# Patient Record
Sex: Female | Born: 1950 | Race: Black or African American | Hispanic: No | Marital: Married | Smoking: Never smoker
Health system: Southern US, Community
[De-identification: ages and names within clinical notes are randomized; demographics above are authoritative.]

## PROBLEM LIST (undated history)

## (undated) DIAGNOSIS — I10 Essential (primary) hypertension: Secondary | ICD-10-CM

## (undated) DIAGNOSIS — M1990 Unspecified osteoarthritis, unspecified site: Secondary | ICD-10-CM

## (undated) HISTORY — DX: Essential (primary) hypertension: I10

## (undated) HISTORY — PX: ABDOMINAL HYSTERECTOMY: SHX81

## (undated) HISTORY — DX: Unspecified osteoarthritis, unspecified site: M19.90

---

## 2016-05-23 ENCOUNTER — Ambulatory Visit (INDEPENDENT_AMBULATORY_CARE_PROVIDER_SITE_OTHER): Payer: BLUE CROSS/BLUE SHIELD | Admitting: Physician Assistant

## 2016-05-23 ENCOUNTER — Encounter: Payer: Self-pay | Admitting: Physician Assistant

## 2016-05-23 VITALS — BP 130/80 | HR 73 | Temp 97.5°F | Resp 17 | Ht 63.0 in | Wt 166.0 lb

## 2016-05-23 DIAGNOSIS — R7309 Other abnormal glucose: Secondary | ICD-10-CM

## 2016-05-23 DIAGNOSIS — Z7189 Other specified counseling: Secondary | ICD-10-CM | POA: Diagnosis not present

## 2016-05-23 DIAGNOSIS — M5137 Other intervertebral disc degeneration, lumbosacral region: Secondary | ICD-10-CM

## 2016-05-23 DIAGNOSIS — Z7689 Persons encountering health services in other specified circumstances: Secondary | ICD-10-CM

## 2016-05-23 DIAGNOSIS — I10 Essential (primary) hypertension: Secondary | ICD-10-CM | POA: Insufficient documentation

## 2016-05-23 DIAGNOSIS — R7303 Prediabetes: Secondary | ICD-10-CM | POA: Insufficient documentation

## 2016-05-23 DIAGNOSIS — M47817 Spondylosis without myelopathy or radiculopathy, lumbosacral region: Secondary | ICD-10-CM | POA: Insufficient documentation

## 2016-05-23 DIAGNOSIS — M8580 Other specified disorders of bone density and structure, unspecified site: Secondary | ICD-10-CM | POA: Diagnosis not present

## 2016-05-23 DIAGNOSIS — E7800 Pure hypercholesterolemia, unspecified: Secondary | ICD-10-CM | POA: Diagnosis not present

## 2016-05-23 MED ORDER — TRIAMTERENE-HCTZ 75-50 MG PO TABS: 1.0000 | ORAL_TABLET | Freq: Every day | ORAL | 1 refills | Status: DC

## 2016-05-23 MED ORDER — TRIAMTERENE-HCTZ 75-50 MG PO TABS: 0.5000 | ORAL_TABLET | Freq: Every day | ORAL | 1 refills | Status: DC

## 2016-05-23 MED ORDER — CALCIUM CARBONATE-VITAMIN D 500-200 MG-UNIT PO TABS: 1.0000 | ORAL_TABLET | Freq: Every day | ORAL | 0 refills | Status: AC

## 2016-05-23 MED ORDER — BENAZEPRIL HCL 5 MG PO TABS: 5.0000 mg | ORAL_TABLET | Freq: Every day | ORAL | 1 refills | Status: DC

## 2016-05-23 NOTE — Patient Instructions (Addendum)
     IF you received an x-ray today, you will receive an invoice from [facility]. Please contact [facility] at [phone] with questions or concerns regarding your invoice.   IF you received labwork today, you will receive an invoice from [facility]. Please contact [facility] at [phone] with questions or concerns regarding your invoice.   Our billing staff will not be able to assist you with questions regarding bills from these companies.  You will be contacted with the lab results as soon as they are available. The fastest way to get your results is to activate your My Chart account. Instructions are located on the last page of this paperwork. If you have not heard from us regarding the results in 2 weeks, please contact this office.     We recommend that you schedule a mammogram for breast cancer screening. Typically, you do not need a referral to do this. Please contact a local imaging center to schedule your mammogram.  [facility] - [phone]  *ask for the Radiology Department [facility]) - [phone] or [phone]  [facility] - [phone] [facility] - [phone] [facility] - [phone]  *ask for the Radiology Department [facility] - [phone]  *ask for the Radiology Department [facility] - [phone]  *ask for the Mammography Department [facility] - [phone]  

## 2016-05-23 NOTE — Progress Notes (Signed)
05/23/2016 3:12 PM   DOB: [birth year] / MRN: [id number]  SUBJECTIVE:  Hannah Copeland is a 65 y.o. female presenting to establish care.  She has a history of elevated A1c, LDL, and takes two medications to control her BP with good success.  She has been taking these for the last 10 years.   She takes Gabapentin for a history of low back pain with good relief of radiculopahty.  Lumbar MR in 2017 shows that she has some significatn DJD at L4, L5 and S1, with severe right foraminal narrowing at L5-S1. (scanned into media)    She had a negative treadmill stress echo in 05/11/15 with a normal response to exercise. (scanned into media)  Bone scan in 2017 showed osteopenia and it was recommended she start oscal with vitamin D.  Repeat that scan 01/31/18. (scanned into media).   Depression screen PHQ 2/9 05/23/2016  Decreased Interest 0  Down, Depressed, Hopeless 0  PHQ - 2 Score 0      She has no allergies on file.   She  has a past medical history of Arthritis and Hypertension.    She  reports that she has never smoked. She has never used smokeless tobacco. She reports that she does not drink alcohol or use drugs. She  reports that she does not engage in sexual activity. The patient  has a past surgical history that includes Abdominal hysterectomy.  Her family history includes Diabetes in her father, paternal grandfather, and sister; Hyperlipidemia in her father; Hypertension in her father and sister.  Review of Systems  Respiratory: Negative for cough and shortness of breath.   Cardiovascular: Negative for chest pain, orthopnea and leg swelling.  Gastrointestinal: Negative for nausea.  Neurological: Negative for dizziness and headaches.    The problem list and medications were reviewed and updated by myself where necessary and exist elsewhere in the encounter.   OBJECTIVE:  BP 130/80 (BP Location: Right Arm, Patient Position: Sitting, Cuff Size: Normal)   Pulse 73   Temp 97.5 F (36.4  C) (Oral)   Resp 17   Ht 5\' 3"  (1.6 m)   Wt 166 lb (75.3 kg)   SpO2 98%   BMI 29.41 kg/m   Physical Exam  Constitutional: She is oriented to person, place, and time. She appears well-developed and well-nourished. No distress.  Cardiovascular: Normal rate, regular rhythm, normal heart sounds and intact distal pulses.  Exam reveals no gallop and no friction rub.   No murmur heard. Pulmonary/Chest: Effort normal and breath sounds normal. She has no wheezes. She has no rales.  Musculoskeletal: Normal range of motion.  Neurological: She is alert and oriented to person, place, and time. She has normal reflexes. She displays normal reflexes. No cranial nerve deficit. She exhibits normal muscle tone. Coordination normal.  Skin: Skin is warm and dry. She is not diaphoretic.  Psychiatric: She has a normal mood and affect. Her behavior is normal. Judgment and thought content normal.    No results found for this or any previous visit (from the past 72 hour(s)).  No results found.  ASSESSMENT AND PLAN  Nicole CellaDorothy was seen today for medication refill.  Diagnoses and all orders for this visit:  Essential hypertension -     CBC -     TSH -     COMPLETE METABOLIC PANEL WITH GFR -     Discontinue: triamterene-hydrochlorothiazide (MAXZIDE) 75-50 MG tablet; Take 1 tablet by mouth daily. -     benazepril (LOTENSIN) 5  MG tablet; Take 1 tablet (5 mg total) by mouth daily. -     triamterene-hydrochlorothiazide (MAXZIDE) 75-50 MG tablet; Take 0.5 tablets by mouth daily.  Encounter to establish care: See HPI.  I am rechecking her BP, A1c and lipid labs.  Advised she follow up in 6 months for refills.  See scanning tab for acces to MRI, bone scan, and stress echo, and previous labs.   Elevated LDL cholesterol level -     Lipid panel  Elevated hemoglobin A1c -     Lipid panel -     Hemoglobin A1C  DJD (degenerative joint disease), lumbosacral: She has enough gabapentin at this time.  Will be happy to  refill via phone if necessary.   Osteopenia -     calcium-vitamin D (OSCAL WITH D) 500-200 MG-UNIT tablet; Take 1 tablet by mouth daily with breakfast.  Other orders -     Cancel: benazepril (LOTENSIN) 5 MG tablet; Take 1 tablet (5 mg total) by mouth daily. -     Cancel: triamterene-hydrochlorothiazide (DYAZIDE) 50-25 MG capsule; Take 1 capsule by mouth every morning. -     Cancel: gabapentin (NEURONTIN) 300 MG capsule; Take 1 capsule (300 mg total) by mouth 3 (three) times daily.    The patient is advised to call or return to clinic if she does not see an improvement in symptoms, or to seek the care of the closest emergency department if she worsens with the above plan.   Deliah Boston, MHS, PA-C [facility] 05/23/2016 3:12 PM

## 2016-05-24 LAB — COMPLETE METABOLIC PANEL WITH GFR
ALT: 14 U/L (ref 6–29)
AST: 18 U/L (ref 10–35)
Albumin: 3.9 g/dL (ref 3.6–5.1)
Alkaline Phosphatase: 84 U/L (ref 33–130)
BUN: 12 mg/dL (ref 7–25)
CHLORIDE: 106 mmol/L (ref 98–110)
CO2: 26 mmol/L (ref 20–31)
CREATININE: 0.74 mg/dL (ref 0.50–0.99)
Calcium: 9.3 mg/dL (ref 8.6–10.4)
GFR, EST NON AFRICAN AMERICAN: 85 mL/min (ref >=60)
GLUCOSE: 79 mg/dL (ref 65–99)
Potassium: 3.9 mmol/L (ref 3.5–5.3)
SODIUM: 141 mmol/L (ref 135–146)
TOTAL PROTEIN: 6.9 g/dL (ref 6.1–8.1)
Total Bilirubin: 0.4 mg/dL (ref 0.2–1.2)

## 2016-05-24 LAB — CBC
HCT: 37.1 % (ref 35.0–45.0)
HEMOGLOBIN: 11.9 g/dL (ref 11.7–15.5)
MCH: 27.3 pg (ref 27.0–33.0)
MCHC: 32.1 g/dL (ref 32.0–36.0)
MCV: 85.1 fL (ref 80.0–100.0)
MPV: 9.4 fL (ref 7.5–12.5)
PLATELETS: 260 10*3/uL (ref 140–400)
RBC: 4.36 MIL/uL (ref 3.80–5.10)
RDW: 13.3 % (ref 11.0–15.0)
WBC: 6.8 10*3/uL (ref 3.8–10.8)

## 2016-05-24 LAB — LIPID PANEL
Cholesterol: 256 mg/dL — ABNORMAL HIGH (ref 125–200)
HDL: 68 mg/dL (ref >=46)
LDL CALC: 163 mg/dL — AB (ref <130)
TRIGLYCERIDES: 124 mg/dL (ref <150)
Total CHOL/HDL Ratio: 3.8 Ratio (ref <=5.0)
VLDL: 25 mg/dL (ref <30)

## 2016-05-24 LAB — HEMOGLOBIN A1C
HEMOGLOBIN A1C: 6.1 % — AB (ref <5.7)
Mean Plasma Glucose: 128 mg/dL

## 2016-05-24 LAB — TSH: TSH: 1.99 m[IU]/L

## 2016-05-25 ENCOUNTER — Other Ambulatory Visit: Payer: Self-pay | Admitting: Physician Assistant

## 2016-05-25 DIAGNOSIS — E7800 Pure hypercholesterolemia, unspecified: Secondary | ICD-10-CM | POA: Insufficient documentation

## 2016-05-25 MED ORDER — ATORVASTATIN CALCIUM 20 MG PO TABS: 20.0000 mg | ORAL_TABLET | Freq: Every day | ORAL | 1 refills | Status: DC

## 2016-12-08 ENCOUNTER — Other Ambulatory Visit: Payer: Self-pay | Admitting: Physician Assistant

## 2016-12-08 DIAGNOSIS — I10 Essential (primary) hypertension: Secondary | ICD-10-CM

## 2017-01-30 ENCOUNTER — Other Ambulatory Visit: Payer: Self-pay | Admitting: Physician Assistant

## 2017-01-30 DIAGNOSIS — I10 Essential (primary) hypertension: Secondary | ICD-10-CM

## 2017-02-18 ENCOUNTER — Encounter: Payer: Self-pay | Admitting: Family Medicine

## 2017-02-18 ENCOUNTER — Ambulatory Visit (INDEPENDENT_AMBULATORY_CARE_PROVIDER_SITE_OTHER): Payer: BLUE CROSS/BLUE SHIELD | Admitting: Family Medicine

## 2017-02-18 VITALS — BP 133/76 | HR 68 | Temp 98.2°F | Resp 18 | Ht 63.0 in | Wt 162.6 lb

## 2017-02-18 DIAGNOSIS — E7800 Pure hypercholesterolemia, unspecified: Secondary | ICD-10-CM | POA: Diagnosis not present

## 2017-02-18 DIAGNOSIS — Z5181 Encounter for therapeutic drug level monitoring: Secondary | ICD-10-CM | POA: Diagnosis not present

## 2017-02-18 DIAGNOSIS — I10 Essential (primary) hypertension: Secondary | ICD-10-CM | POA: Diagnosis not present

## 2017-02-18 DIAGNOSIS — R600 Localized edema: Secondary | ICD-10-CM

## 2017-02-18 DIAGNOSIS — Z1231 Encounter for screening mammogram for malignant neoplasm of breast: Secondary | ICD-10-CM

## 2017-02-18 DIAGNOSIS — R7303 Prediabetes: Secondary | ICD-10-CM

## 2017-02-18 MED ORDER — BENAZEPRIL HCL 5 MG PO TABS: ORAL_TABLET | ORAL | 1 refills | Status: DC

## 2017-02-18 MED ORDER — TRIAMTERENE-HCTZ 75-50 MG PO TABS: 0.5000 | ORAL_TABLET | Freq: Every day | ORAL | 1 refills | Status: DC

## 2017-02-18 NOTE — Progress Notes (Addendum)
Subjective:    Patient ID: Hannah Copeland, female    DOB: 1951-02-11, 66 y.o.   MRN: [id number] Chief Complaint  Patient presents with  . Medication Refill    Atorvastatin 20mg  ,  Triamterene HCTZ 75-50mg , Benazepril 5mg  and wants blood work    HPI  HLD: Patient was initially started on statin 9 months ago after labs returned showing LDL of 163 and non-HDL of 308 with elevated ASCVD risk. Patient has not returned to clinic for recheck since.  She was on simvastatin with myalgias but now doesn't which she attributes to the benazepril helping.   Ate breakfast at 5:15 a.m. Before she goes to work - oatmeal, sardines, bread. Then bowl of cereal at 9 a. And fastin since - very hungry. So fasting for 8 hours.    No urinary sxs though does notice increased urinary frequency/quantity on some days. DOes bave some urinary urgency at times but t  Does get cc lower ext edema with varicose veins intermittently.   Does have 32yrs of sleep difficlty with early wakenings. She has always tried to work on sleep hygiene habits. Once she was rx'ed [place]. She tried melatonin once but was up all night. Used to take benadryl .  Sleeps better after going on a long walk. Drinks chamomile tea.   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 Then tried cortisone shots.Was hgaving foot muscle spasms/contractions. Has arthritis in hands. Initially did cause sedation but then  Resolves so now uses prn.   Did have nml stress test last yr. POET. Does not recall echo but was nervous.   Did try compression socks once which helped - consider trying again.    Past Medical History:  Diagnosis Date  . Arthritis   . Hypertension    Past Surgical History:  Procedure Laterality Date  . ABDOMINAL HYSTERECTOMY     Current Outpatient Prescriptions on File Prior to Visit  Medication Sig Dispense Refill  . atorvastatin (LIPITOR) 20  MG tablet TAKE 1 TABLET(20 MG) BY MOUTH DAILY 30 tablet 0  . atorvastatin (LIPITOR) 20 MG tablet TAKE 1 TABLET(20 MG) BY MOUTH DAILY 30 tablet 0  . benazepril (LOTENSIN) 5 MG tablet TAKE 1 TABLET(5 MG) BY MOUTH DAILY 30 tablet 0  . benazepril (LOTENSIN) 5 MG tablet TAKE 1 TABLET(5 MG) BY MOUTH DAILY 30 tablet 0  . calcium-vitamin D (OSCAL WITH D) 500-200 MG-UNIT tablet Take 1 tablet by mouth daily with breakfast. 30 tablet 0  . triamterene-hydrochlorothiazide (MAXZIDE) 75-50 MG tablet Take 0.5 tablets by mouth daily. 45 tablet 1  . gabapentin (NEURONTIN) 300 MG capsule Take 300 mg by mouth 3 (three) times daily.     No current facility-administered medications on file prior to visit.    No Known Allergies Family History  Problem Relation Age of Onset  . Diabetes Father   . Hyperlipidemia Father   . Hypertension Father   . Diabetes Paternal Grandfather   . Diabetes Sister   . Hypertension Sister    Social History   Social History  . Marital status: Married    Spouse name: N/A  . Number of children: N/A  . Years of education: N/A   Social History Main Topics  . Smoking status: Never Smoker  . Smokeless tobacco: Never Used  . Alcohol use No  . Drug use: No  . Sexual activity: No  Other Topics Concern  . None   Social History Narrative  . None    Neck pops a lot.  Had 2 episodes of tingling in left hand no weakenss but sometmes hands cramp up.  Review of Systems     Objective:   Physical Exam  Constitutional: She is oriented to person, place, and time. She appears well-developed and well-nourished. No distress.  HENT:  Head: Normocephalic and atraumatic.  Right Ear: External ear normal.  Left Ear: External ear normal.  Eyes: Conjunctivae are normal. No scleral icterus.  Neck: Normal range of motion. Neck supple. No thyromegaly present.  Cardiovascular: Normal rate, regular rhythm, normal heart sounds and intact distal pulses.   2+ pitting edema to knees    Pulmonary/Chest: Effort normal and breath sounds normal. No respiratory distress.  Musculoskeletal: She exhibits no edema.  Lymphadenopathy:    She has no cervical adenopathy.  Neurological: She is alert and oriented to person, place, and time.  Skin: Skin is warm and dry. She is not diaphoretic. No erythema.  Psychiatric: She has a normal mood and affect. Her behavior is normal.   BP 133/76   Pulse 68   Temp 98.2 F (36.8 C) (Oral)   Resp 18   Ht 5\' 3"  (1.6 m)   Wt 162 lb 9.6 oz (73.8 kg)   SpO2 97%   BMI 28.80 kg/m     [facility] reading (PRIMARY) by  Dr. Clelia CroftShaw. EKG: NSR, no acute ischemic changes, none prior avail for comparison Assessment & Plan:  Refill lipitor after labs come back.   1. Essential hypertension - controlled on current regimen, cont  2. Prediabetes - recheck in 6 mos Lab Results  Component Value Date   HGBA1C 6.4 (H) 02/18/2017    3. Hypercholesterolemia - for some reason, pt's lipid panel just did not result so cont atorvastatin 20 for now and needs to be rechecked at next OV in 6 mos  4. Medication monitoring encounter   5. Pedal edema - bmp nml  6. Visit for screening mammogram     Orders Placed This Encounter  Procedures  . MM Digital Screening    Standing Status:   Future    Standing Expiration Date:   04/20/2018    Order Specific Question:   Reason for Exam (SYMPTOM  OR DIAGNOSIS REQUIRED)    Answer:   screening    Order Specific Question:   Preferred imaging [facility]?    [facility]  . Comprehensive metabolic panel  . Hemoglobin A1c  . Lipid panel    Order Specific Question:   Has the patient fasted?    Answer:   Yes  . Brain natriuretic peptide  . Brain natriuretic peptide  . EKG 12-Lead    Meds ordered this encounter  Medications  . triamterene-hydrochlorothiazide (MAXZIDE) 75-50 MG tablet    Sig: Take 0.5 tablets by mouth daily.    Dispense:  45 tablet    Refill:  1  . benazepril (LOTENSIN) 5 MG tablet    Sig: TAKE 1  TABLET(5 MG) BY MOUTH DAILY    Dispense:  90 tablet    Refill:  1    Needs office visit for refills     Norberto SorensonEva Nadia Torr, M.D.  Primary Care at [facility]  [place] [address] [place], [place] [address] [phone] phone [phone] fax  02/20/17 11:59 PM

## 2017-02-18 NOTE — Patient Instructions (Addendum)
   We recommend that you schedule a mammogram for breast cancer screening. Typically, you do not need a referral to do this. Please contact a local imaging center to schedule your mammogram.  [facility] - [phone] or [phone]   IF you received an x-ray today, you will receive an invoice from [facility]. Please contact [facility] at [phone] with questions or concerns regarding your invoice.   IF you received labwork today, you will receive an invoice from [facility]. Please contact [facility] at [phone] with questions or concerns regarding your invoice.   Our billing staff will not be able to assist you with questions regarding bills from these companies.  You will be contacted with the lab results as soon as they are available. The fastest way to get your results is to activate your My Chart account. Instructions are located on the last page of this paperwork. If you have not heard from us regarding the results in 2 weeks, please contact this office.      

## 2017-02-19 LAB — COMPREHENSIVE METABOLIC PANEL
A/G RATIO: 1.5 (ref 1.2–2.2)
ALBUMIN: 4.2 g/dL (ref 3.6–4.8)
ALT: 19 IU/L (ref 0–32)
AST: 21 IU/L (ref 0–40)
Alkaline Phosphatase: 93 IU/L (ref 39–117)
BILIRUBIN TOTAL: 0.3 mg/dL (ref 0.0–1.2)
BUN / CREAT RATIO: 23 (ref 12–28)
BUN: 18 mg/dL (ref 8–27)
CALCIUM: 9.5 mg/dL (ref 8.7–10.3)
CHLORIDE: 104 mmol/L (ref 96–106)
CO2: 28 mmol/L (ref 18–29)
Creatinine, Ser: 0.78 mg/dL (ref 0.57–1.00)
GFR, EST AFRICAN AMERICAN: 91 mL/min/{1.73_m2} (ref >59)
GFR, EST NON AFRICAN AMERICAN: 79 mL/min/{1.73_m2} (ref >59)
GLUCOSE: 87 mg/dL (ref 65–99)
Globulin, Total: 2.8 g/dL (ref 1.5–4.5)
Potassium: 3.9 mmol/L (ref 3.5–5.2)
Sodium: 143 mmol/L (ref 134–144)
TOTAL PROTEIN: 7 g/dL (ref 6.0–8.5)

## 2017-02-19 LAB — BRAIN NATRIURETIC PEPTIDE: BNP: 42.4 pg/mL (ref 0.0–100.0)

## 2017-02-19 LAB — HEMOGLOBIN A1C
Est. average glucose Bld gHb Est-mCnc: 137 mg/dL
Hgb A1c MFr Bld: 6.4 % — ABNORMAL HIGH (ref 4.8–5.6)

## 2017-04-03 ENCOUNTER — Other Ambulatory Visit: Payer: Self-pay | Admitting: Family Medicine

## 2017-04-25 ENCOUNTER — Ambulatory Visit (HOSPITAL_BASED_OUTPATIENT_CLINIC_OR_DEPARTMENT_OTHER)
Admission: RE | Admit: 2017-04-25 | Discharge: 2017-04-25 | Disposition: A | Discharge status: Home / Self Care | Payer: BLUE CROSS/BLUE SHIELD | Source: Ambulatory Visit | Attending: Family Medicine | Admitting: Family Medicine

## 2017-04-25 ENCOUNTER — Encounter (HOSPITAL_BASED_OUTPATIENT_CLINIC_OR_DEPARTMENT_OTHER): Payer: Self-pay

## 2017-04-25 DIAGNOSIS — Z1231 Encounter for screening mammogram for malignant neoplasm of breast: Secondary | ICD-10-CM | POA: Diagnosis not present

## 2017-05-02 ENCOUNTER — Other Ambulatory Visit: Payer: Self-pay | Admitting: Family Medicine

## 2017-05-02 DIAGNOSIS — I10 Essential (primary) hypertension: Secondary | ICD-10-CM

## 2017-05-10 ENCOUNTER — Other Ambulatory Visit: Payer: Self-pay | Admitting: Family Medicine

## 2017-06-21 ENCOUNTER — Other Ambulatory Visit: Payer: Self-pay | Admitting: Family Medicine

## 2017-06-21 DIAGNOSIS — I10 Essential (primary) hypertension: Secondary | ICD-10-CM

## 2017-07-17 ENCOUNTER — Other Ambulatory Visit: Payer: Self-pay | Admitting: Family Medicine

## 2017-07-17 DIAGNOSIS — I10 Essential (primary) hypertension: Secondary | ICD-10-CM

## 2017-08-05 ENCOUNTER — Other Ambulatory Visit: Payer: Self-pay | Admitting: Family Medicine

## 2017-08-05 DIAGNOSIS — I10 Essential (primary) hypertension: Secondary | ICD-10-CM

## 2017-08-05 NOTE — Telephone Encounter (Signed)
Needs  Office  Visit  For  Refill

## 2017-08-06 ENCOUNTER — Other Ambulatory Visit: Payer: Self-pay

## 2017-08-06 ENCOUNTER — Other Ambulatory Visit: Payer: Self-pay | Admitting: Family Medicine

## 2017-08-06 DIAGNOSIS — I10 Essential (primary) hypertension: Secondary | ICD-10-CM

## 2017-08-06 MED ORDER — BENAZEPRIL HCL 5 MG PO TABS: 5.0000 mg | ORAL_TABLET | Freq: Every day | ORAL | 0 refills | Status: DC

## 2017-08-13 ENCOUNTER — Ambulatory Visit: Payer: BLUE CROSS/BLUE SHIELD | Admitting: Physician Assistant

## 2017-08-13 ENCOUNTER — Other Ambulatory Visit: Payer: Self-pay

## 2017-08-13 ENCOUNTER — Encounter: Payer: Self-pay | Admitting: Physician Assistant

## 2017-08-13 VITALS — BP 118/70 | HR 80 | Temp 98.1°F | Resp 18 | Ht 63.0 in | Wt 162.2 lb

## 2017-08-13 DIAGNOSIS — H6120 Impacted cerumen, unspecified ear: Secondary | ICD-10-CM | POA: Diagnosis not present

## 2017-08-13 NOTE — Patient Instructions (Signed)
Thank you for letting me participate in your health and well being.   Earwax Buildup, Adult The ears produce a substance called earwax that helps keep bacteria out of the ear and protects the skin in the ear canal. Occasionally, earwax can build up in the ear and cause discomfort or hearing loss. What increases the risk? This condition is more likely to develop in people who:  Are female.  Are elderly.  Naturally produce more earwax.  Clean their ears often with cotton swabs.  Use earplugs often.  Use in-ear headphones often.  Wear hearing aids.  Have narrow ear canals.  Have earwax that is overly thick or sticky.  Have eczema.  Are dehydrated.  Have excess hair in the ear canal.  What are the signs or symptoms? Symptoms of this condition include:  Reduced or muffled hearing.  A feeling of fullness in the ear or feeling that the ear is plugged.  Fluid coming from the ear.  Ear pain.  Ear itch.  Ringing in the ear.  Coughing.  An obvious piece of earwax that can be seen inside the ear canal.  How is this diagnosed? This condition may be diagnosed based on:  Your symptoms.  Your medical history.  An ear exam. During the exam, your health care provider will look into your ear with an instrument called an otoscope.  You may have tests, including a hearing test. How is this treated? This condition may be treated by:  Using ear drops to soften the earwax.  Having the earwax removed by a health care provider. The health care provider may: ? Flush the ear with water. ? Use an instrument that has a loop on the end (curette). ? Use a suction device.  Surgery to remove the wax buildup. This may be done in severe cases.  Follow these instructions at home:  Take over-the-counter and prescription medicines only as told by your health care provider.  Do not put any objects, including cotton swabs, into your ear. You can clean the opening of your ear canal  with a washcloth or facial tissue.  Follow instructions from your health care provider about cleaning your ears. Do not over-clean your ears.  Drink enough fluid to keep your urine clear or pale yellow. This will help to thin the earwax.  Keep all follow-up visits as told by your health care provider. If earwax builds up in your ears often or if you use hearing aids, consider seeing your health care provider for routine, preventive ear cleanings. Ask your health care provider how often you should schedule your cleanings.  If you have hearing aids, clean them according to instructions from the manufacturer and your health care provider. Contact a health care provider if:  You have ear pain.  You develop a fever.  You have blood, pus, or other fluid coming from your ear.  You have hearing loss.  You have ringing in your ears that does not go away.  Your symptoms do not improve with treatment.  You feel like the room is spinning (vertigo). Summary  Earwax can build up in the ear and cause discomfort or hearing loss.  The most common symptoms of this condition include reduced or muffled hearing and a feeling of fullness in the ear or feeling that the ear is plugged.  This condition may be diagnosed based on your symptoms, your medical history, and an ear exam.  This condition may be treated by using ear drops to soften the  earwax or by having the earwax removed by a health care provider.  Do not put any objects, including cotton swabs, into your ear. You can clean the opening of your ear canal with a washcloth or facial tissue. This information is not intended to replace advice given to you by your health care provider. Make sure you discuss any questions you have with your health care provider. Document Released: 10/11/2004 Document Revised: 11/14/2016 Document Reviewed: 11/14/2016 Elsevier Interactive Patient Education  [employer]2018 Elsevier Inc.

## 2017-08-13 NOTE — Progress Notes (Signed)
   Hannah Copeland  MRN: [id number] DOB: 06-11-1951  Subjective:  Hannah Copeland is a 66 y.o. female seen in office today for a chief complaint of need of ear flushing. She feels like her left ear canal has some wax that keeps moving in it. Denies ear pain,ear itching, ear discharge, tinnitus, and hearing loss. Denies use of earbuds or qtips. Has not tried anything for relief. Has had to get them cleaned out in the past. Denies smoking.   Review of Systems  Constitutional: Negative for chills, fatigue and fever.  HENT: Negative for congestion and sinus pressure.   Neurological: Negative for dizziness and light-headedness.    Patient Active Problem List   Diagnosis Date Noted  . Hypercholesterolemia 05/25/2016  . DJD (degenerative joint disease), lumbosacral 05/23/2016  . Osteopenia 05/23/2016  . HTN (hypertension) 05/23/2016  . Prediabetes 05/23/2016    Current Outpatient Medications on File Prior to Visit  Medication Sig Dispense Refill  . atorvastatin (LIPITOR) 20 MG tablet TAKE 1 TABLET(20 MG) BY MOUTH DAILY 90 tablet 1  . benazepril (LOTENSIN) 5 MG tablet Take 1 tablet (5 mg total) daily by mouth. 30 tablet 0  . calcium-vitamin D (OSCAL WITH D) 500-200 MG-UNIT tablet Take 1 tablet by mouth daily with breakfast. 30 tablet 0  . gabapentin (NEURONTIN) 300 MG capsule Take 300 mg by mouth 3 (three) times daily.    Marland Kitchen. triamterene-hydrochlorothiazide (MAXZIDE) 75-50 MG tablet Take 0.5 tablets by mouth daily. 45 tablet 1   No current facility-administered medications on file prior to visit.     No Known Allergies   Objective:  BP 118/70   Pulse 80   Temp 98.1 F (36.7 C) (Oral)   Resp 18   Ht 5\' 3"  (1.6 m)   Wt 162 lb 3.2 oz (73.6 kg)   SpO2 96%   BMI 28.73 kg/m   Physical Exam  Constitutional: She is oriented to person, place, and time and well-developed, well-nourished, and in no distress.  HENT:  Head: Normocephalic and atraumatic.  Right Ear: Tympanic membrane normal.  There is drainage ( mild amount of dark brown cerumen in ear canal,  TM visible). No mastoid tenderness. Tympanic membrane is not erythematous and not bulging. No decreased hearing is noted.  Left Ear: Tympanic membrane normal. There is drainage (moderate amount of dark brown cerumen sitting in ear canal, TM still visible. ). No mastoid tenderness. Tympanic membrane is not erythematous and not bulging. No decreased hearing is noted.  Eyes: Conjunctivae are normal.  Neck: Normal range of motion.  Pulmonary/Chest: Effort normal.  Neurological: She is alert and oriented to person, place, and time. Gait normal.  Skin: Skin is warm and dry.  Psychiatric: Affect normal.  Vitals reviewed.  No cerumen noted in left ear canal after ear lavage.  Mild cerumen noted in right ear canal.  Ear curette used to remove remaining cerumen.  Patient tolerated well.  Notes the sensation of something in her ear canal she was having before the ear lavage was performed has resolved. Assessment and Plan :  1. Wax in ear Resolved.  Patient reports she feels much better.  Follow-up as needed. - Ear wax removal  Benjiman CoreBrittany Annali Lybrand PA-C  Primary Care at [facility]  [facility] 08/13/2017 11:53 AM

## 2017-08-14 ENCOUNTER — Other Ambulatory Visit: Payer: Self-pay | Admitting: Family Medicine

## 2017-08-14 DIAGNOSIS — I10 Essential (primary) hypertension: Secondary | ICD-10-CM

## 2017-10-19 ENCOUNTER — Other Ambulatory Visit: Payer: Self-pay | Admitting: Family Medicine

## 2017-10-21 NOTE — Telephone Encounter (Signed)
Requesting refill for gabapentin. Historical provider.  Please advise.

## 2017-10-23 ENCOUNTER — Other Ambulatory Visit: Payer: Self-pay | Admitting: Family Medicine

## 2017-10-23 MED ORDER — GABAPENTIN 300 MG PO CAPS: 300.0000 mg | ORAL_CAPSULE | Freq: Three times a day (TID) | ORAL | 11 refills | Status: DC

## 2017-10-23 NOTE — Progress Notes (Signed)
Refill sent in

## 2017-11-09 ENCOUNTER — Other Ambulatory Visit: Payer: Self-pay

## 2017-11-09 ENCOUNTER — Encounter: Payer: Self-pay | Admitting: Family Medicine

## 2017-11-09 ENCOUNTER — Ambulatory Visit: Payer: BLUE CROSS/BLUE SHIELD | Admitting: Family Medicine

## 2017-11-09 VITALS — BP 118/68 | HR 68 | Temp 97.9°F | Resp 18 | Ht 63.0 in | Wt 161.4 lb

## 2017-11-09 DIAGNOSIS — I10 Essential (primary) hypertension: Secondary | ICD-10-CM

## 2017-11-09 DIAGNOSIS — E119 Type 2 diabetes mellitus without complications: Secondary | ICD-10-CM | POA: Diagnosis not present

## 2017-11-09 DIAGNOSIS — Z5181 Encounter for therapeutic drug level monitoring: Secondary | ICD-10-CM | POA: Diagnosis not present

## 2017-11-09 DIAGNOSIS — E7800 Pure hypercholesterolemia, unspecified: Secondary | ICD-10-CM | POA: Diagnosis not present

## 2017-11-09 LAB — POCT URINALYSIS DIP (MANUAL ENTRY)
BILIRUBIN UA: NEGATIVE mg/dL
Bilirubin, UA: NEGATIVE
Glucose, UA: NEGATIVE mg/dL
Nitrite, UA: NEGATIVE
PH UA: 6 (ref 5.0–8.0)
Protein Ur, POC: NEGATIVE mg/dL
RBC UA: NEGATIVE
Spec Grav, UA: <=1.005 — AB (ref 1.010–1.025)
Urobilinogen, UA: 0.2 E.U./dL

## 2017-11-09 LAB — POCT GLYCOSYLATED HEMOGLOBIN (HGB A1C): Hemoglobin A1C: 6.8

## 2017-11-09 MED ORDER — ATORVASTATIN CALCIUM 20 MG PO TABS: ORAL_TABLET | ORAL | 3 refills | Status: DC

## 2017-11-09 MED ORDER — TRIAMTERENE-HCTZ 75-50 MG PO TABS: 0.5000 | ORAL_TABLET | Freq: Every day | ORAL | 1 refills | Status: DC

## 2017-11-09 NOTE — Patient Instructions (Addendum)
   IF you received an x-ray today, you will receive an invoice from [facility]. Please contact  Radiology at [phone] with questions or concerns regarding your invoice.   IF you received labwork today, you will receive an invoice from [facility]. Please contact [facility] at [phone] with questions or concerns regarding your invoice.   Our billing staff will not be able to assist you with questions regarding bills from these companies.  You will be contacted with the lab results as soon as they are available. The fastest way to get your results is to activate your My Chart account. Instructions are located on the last page of this paperwork. If you have not heard from us regarding the results in 2 weeks, please contact this office.     Diabetes Mellitus and Nutrition When you have diabetes (diabetes mellitus), it is very important to have healthy eating habits because your blood sugar (glucose) levels are greatly affected by what you eat and drink. Eating healthy foods in the appropriate amounts, at about the same times every day, can help you:  Control your blood glucose.  Lower your risk of heart disease.  Improve your blood pressure.  Reach or maintain a healthy weight.  Every person with diabetes is different, and each person has different needs for a meal plan. Your health care provider may recommend that you work with a diet and nutrition specialist (dietitian) to make a meal plan that is best for you. Your meal plan may vary depending on factors such as:  The calories you need.  The medicines you take.  Your weight.  Your blood glucose, blood pressure, and cholesterol levels.  Your activity level.  Other health conditions you have, such as heart or kidney disease.  How do carbohydrates affect me? Carbohydrates affect your blood glucose level more than any other type of food. Eating carbohydrates naturally increases the amount of glucose in your  blood. Carbohydrate counting is a method for keeping track of how many carbohydrates you eat. Counting carbohydrates is important to keep your blood glucose at a healthy level, especially if you use insulin or take certain oral diabetes medicines. It is important to know how many carbohydrates you can safely have in each meal. This is different for every person. Your dietitian can help you calculate how many carbohydrates you should have at each meal and for snack. Foods that contain carbohydrates include:  Bread, cereal, rice, pasta, and crackers.  Potatoes and corn.  Peas, beans, and lentils.  Milk and yogurt.  Fruit and juice.  Desserts, such as cakes, cookies, ice cream, and candy.  How does alcohol affect me? Alcohol can cause a sudden decrease in blood glucose (hypoglycemia), especially if you use insulin or take certain oral diabetes medicines. Hypoglycemia can be a life-threatening condition. Symptoms of hypoglycemia (sleepiness, dizziness, and confusion) are similar to symptoms of having too much alcohol. If your health care provider says that alcohol is safe for you, follow these guidelines:  Limit alcohol intake to no more than 1 drink per day for nonpregnant women and 2 drinks per day for men. One drink equals 12 oz of beer, 5 oz of wine, or 1 oz of hard liquor.  Do not drink on an empty stomach.  Keep yourself hydrated with water, diet soda, or unsweetened iced tea.  Keep in mind that regular soda, juice, and other mixers may contain a lot of sugar and must be counted as carbohydrates.  What are tips for following   this plan? Reading food labels  Start by checking the serving size on the label. The amount of calories, carbohydrates, fats, and other nutrients listed on the label are based on one serving of the food. Many foods contain more than one serving per package.  Check the total grams (g) of carbohydrates in one serving. You can calculate the number of servings of  carbohydrates in one serving by dividing the total carbohydrates by 15. For example, if a food has 30 g of total carbohydrates, it would be equal to 2 servings of carbohydrates.  Check the number of grams (g) of saturated and trans fats in one serving. Choose foods that have low or no amount of these fats.  Check the number of milligrams (mg) of sodium in one serving. Most people should limit total sodium intake to less than 2,300 mg per day.  Always check the nutrition information of foods labeled as "low-fat" or "nonfat". These foods may be higher in added sugar or refined carbohydrates and should be avoided.  Talk to your dietitian to identify your daily goals for nutrients listed on the label. Shopping  Avoid buying canned, premade, or processed foods. These foods tend to be high in fat, sodium, and added sugar.  Shop around the outside edge of the grocery store. This includes fresh fruits and vegetables, bulk grains, fresh meats, and fresh dairy. Cooking  Use low-heat cooking methods, such as baking, instead of high-heat cooking methods like deep frying.  Cook using healthy oils, such as olive, canola, or sunflower oil.  Avoid cooking with butter, cream, or high-fat meats. Meal planning  Eat meals and snacks regularly, preferably at the same times every day. Avoid going long periods of time without eating.  Eat foods high in fiber, such as fresh fruits, vegetables, beans, and whole grains. Talk to your dietitian about how many servings of carbohydrates you can eat at each meal.  Eat 4-6 ounces of lean protein each day, such as lean meat, chicken, fish, eggs, or tofu. 1 ounce is equal to 1 ounce of meat, chicken, or fish, 1 egg, or 1/4 cup of tofu.  Eat some foods each day that contain healthy fats, such as avocado, nuts, seeds, and fish. Lifestyle   Check your blood glucose regularly.  Exercise at least 30 minutes 5 or more days each week, or as told by your health care  provider.  Take medicines as told by your health care provider.  Do not use any products that contain nicotine or tobacco, such as cigarettes and e-cigarettes. If you need help quitting, ask your health care provider.  Work with a counselor or diabetes educator to identify strategies to manage stress and any emotional and social challenges. What are some questions to ask my health care provider?  Do I need to meet with a diabetes educator?  Do I need to meet with a dietitian?  What number can I call if I have questions?  When are the best times to check my blood glucose? Where to find more information:  American Diabetes Association: diabetes.org/food-and-fitness/food  Academy of Nutrition and Dietetics: www.eatright.org/resources/health/diseases-and-conditions/diabetes  National Institute of Diabetes and Digestive and Kidney Diseases (NIH): www.niddk.nih.gov/health-information/diabetes/overview/diet-eating-physical-activity Summary  A healthy meal plan will help you control your blood glucose and maintain a healthy lifestyle.  Working with a diet and nutrition specialist (dietitian) can help you make a meal plan that is best for you.  Keep in mind that carbohydrates and alcohol have immediate effects on your blood glucose   levels. It is important to count carbohydrates and to use alcohol carefully. This information is not intended to replace advice given to you by your health care provider. Make sure you discuss any questions you have with your health care provider. Document Released: 05/31/2005 Document Revised: 10/08/2016 Document Reviewed: 10/08/2016 Elsevier Interactive Patient Education  2018 Elsevier Inc.  

## 2017-11-09 NOTE — Progress Notes (Signed)
By signing my name below, I, Thelma Barge, attest that this documentation has been prepared under the direction and in the presence of Clelia Croft Levell July, MD. Electronically Signed: Thelma Barge, Medical Scribe 11/09/2017 at 9:33 AM. Subjective:    Patient ID: Hannah Copeland, female    DOB: 09-14-1951, 67 y.o.   MRN: [id number]  HPI Chief Complaint  Patient presents with  . Hypertension    Pt states she sometimes checks BP at home. Pt states yesterday BP was 128/60 at the gym.  . Follow-up   Hannah Copeland is a 67 y.o. female who presents to Primary Care at [facility] for follow up of hypertension. Her daughter is my colleague Collie Siad, MD.  HTN:  She is not compliant with her medications and only takes them when she has a HA. She is currently on benazepril 0.5 tablets and triamterene-hctz 0.5 tablets. She takes both of them every other day, and when she is not compliant, she does not take either of them. She checks her BP at home or at the gym. When she takes her meds, she takes them in the mornings. Her daughter zoe helps her a lot. She has some mild leg swelling, but states this is because she is on her feet all day taking care of her children. She is wanting to reduce her daily HTN meds. She reports some hand pain.  She takes gabapentin (1 tablet) only as needed. This works well for her. Occasionally she is able to bear the pain and does not take this medication.   H/o pre-DM, new-diagnoses today of DM2: A1c 6.8 today. Last OV's 6.4 and 6.1. Pt has FMHx DM2 (father). She has been checking her blood sugar because her husband is a diabetic and she recently got a glucometer.  She states she has been eating less because she is trying to lose weight. She reports eating a very small breakfast and lunch. She is very encouraged to improve her blood sugar through diet and exercise. She declines referral to nutritionist at this time. She eats a lot of corn and plantains.   She has not been to an eye doctor  in the last year.   Past Medical History:  Diagnosis Date  . Arthritis   . Hypertension    Past Surgical History:  Procedure Laterality Date  . ABDOMINAL HYSTERECTOMY     Current Outpatient Medications on File Prior to Visit  Medication Sig Dispense Refill  . atorvastatin (LIPITOR) 20 MG tablet TAKE 1 TABLET(20 MG) BY MOUTH DAILY 90 tablet 1  . benazepril (LOTENSIN) 5 MG tablet Take 1 tablet (5 mg total) daily by mouth. 30 tablet 0  . calcium-vitamin D (OSCAL WITH D) 500-200 MG-UNIT tablet Take 1 tablet by mouth daily with breakfast. 30 tablet 0  . gabapentin (NEURONTIN) 300 MG capsule Take 1 capsule (300 mg total) by mouth 3 (three) times daily. 90 capsule 11  . triamterene-hydrochlorothiazide (MAXZIDE) 75-50 MG tablet TAKE 1/2 TABLET BY MOUTH DAILY 45 tablet 1   No current facility-administered medications on file prior to visit.    Allergies  Allergen Reactions  . Ibuprofen Nausea Only   Family History  Problem Relation Age of Onset  . Diabetes Father   . Hyperlipidemia Father   . Hypertension Father   . Diabetes Paternal Grandfather   . Diabetes Sister   . Hypertension Sister    Social History   Socioeconomic History  . Marital status: Married    Spouse name: None  . Number of  children: None  . Years of education: None  . Highest education level: None  Social Needs  . Financial resource strain: None  . Food insecurity - worry: None  . Food insecurity - inability: None  . Transportation needs - medical: None  . Transportation needs - non-medical: None  Occupational History  . None  Tobacco Use  . Smoking status: Never Smoker  . Smokeless tobacco: Never Used  Substance and Sexual Activity  . Alcohol use: No  . Drug use: No  . Sexual activity: No  Other Topics Concern  . None  Social History Narrative  . None   Depression screen [facility] 2/9 11/09/2017 08/13/2017 02/18/2017 05/23/2016  Decreased Interest 0 0 0 0  Down, Depressed, Hopeless 0 0 0 0  PHQ - 2 Score  0 0 0 0    Review of Systems  Constitutional: Negative for activity change, appetite change, chills, diaphoresis and fever.  Eyes: Negative for visual disturbance.  Respiratory: Negative for cough and shortness of breath.   Cardiovascular: Positive for leg swelling. Negative for chest pain and palpitations.  Genitourinary: Negative for decreased urine volume.  Musculoskeletal: Positive for arthralgias.  Neurological: Positive for headaches. Negative for syncope.  Hematological: Does not bruise/bleed easily.       Objective:   Physical Exam  Constitutional: She is oriented to person, place, and time. She appears well-developed and well-nourished. No distress.  HENT:  Head: Normocephalic and atraumatic.  Right Ear: External ear normal.  Left Ear: External ear normal.  Eyes: Conjunctivae are normal. No scleral icterus.  Neck: Normal range of motion. Neck supple. No thyromegaly present.  Cardiovascular: Normal rate, regular rhythm, normal heart sounds and intact distal pulses.  Pulmonary/Chest: Effort normal and breath sounds normal. No respiratory distress.  Musculoskeletal: She exhibits no edema.  Lymphadenopathy:    She has no cervical adenopathy.  Neurological: She is alert and oriented to person, place, and time.  Skin: Skin is warm and dry. She is not diaphoretic. No erythema.  Psychiatric: She has a normal mood and affect. Her behavior is normal.     BP 118/68 (BP Location: Left Arm, Patient Position: Sitting, Cuff Size: Large)   Pulse 68   Temp 97.9 F (36.6 C) (Oral)   Resp 18   Ht 5\' 3"  (1.6 m)   Wt 161 lb 6.4 oz (73.2 kg)   SpO2 98%   BMI 28.59 kg/m    Results for orders placed or performed in visit on 11/09/17 POCT glycosylated hemoglobin (Hb A1C)  Result Value Ref Range   Hemoglobin A1C 6.8   POCT urinalysis dipstick  Result Value Ref Range   Color, UA yellow yellow   Clarity, UA clear clear   Glucose, UA negative negative mg/dL   Bilirubin, UA negative  negative   Ketones, POC UA negative negative mg/dL   Spec Grav, UA <=1.610 (A) 1.010 - 1.025   Blood, UA negative negative   pH, UA 6.0 5.0 - 8.0   Protein Ur, POC negative negative mg/dL   Urobilinogen, UA 0.2 0.2 or 1.0 E.U./dL   Nitrite, UA Negative Negative   Leukocytes, UA Trace (A) Negative    Assessment & Plan:   1. Prediabetes - new diagnosis Type 2 diabetes mellitus without complication, without long-term current use of insulin today - pt very shocked as she is exercising and felt like she had a healthy diet. She does happen to have a glucometer which she was going to use along in moral support of  her husband so discussed using this to check her cbg 2 hrs after a meal to give feed back. Rec looking at food label "total carbs" - "dietary fiber" and try to not eat any foods with >10g carb/sugar - especially for a small snack - pointed out that her "healthy snack" fiber one bars have the first 2 ingredients of "wheat, sugar." Pt declines referral to nutritionist for now - feels like she already knows what to do so encouraged to ensure her cbg 2 hrs after eating is < 150 to confirm her healthy food choices. Recheck a1c in 4- 6 mos. Refer to ophtho. Needs microalbumin and foot exam at next OV. Discuss starting asa next visit.  2. Essential hypertension - wanting to take less medication so occasionally just not taking either her benazepril or maxzide 1-2d/wk - pt reports always great at home though she doesn't check it - just knows when it is elev due to HA -so reinforced need for DAILY compliance of medication and waiting until symptomatic from HTN to treat will cause permanent end-organ damage. However, since BP still so good despite pt not taken in 2d (today, yest), I think it is reasonable to try her off of one of her low dose BP meds. Does have some pedal edema so will leave on maxzide 37.5-25 but advised trial off benazepril 5 - pt concerned as she reports a h/o B hand numbness which she was  told due to K abnml which resolved after starting benazepril but advised can always restart if K sxs recur.  Requested to call w/ update in 1 mo as to BP and hand numbness, recheck in OV in 4 mos.  3. Hypercholesterolemia - started atorvastatin 20 05/2016 but have not had repeat lipid panel since - fasting today  4. Medication monitoring encounter   Ok to cont gabapentin 300 - only taking qd prn.  Orders Placed This Encounter  Procedures  . Comprehensive metabolic panel  . Lipid panel    Order Specific Question:   Has the patient fasted?    Answer:   Yes  . POCT glycosylated hemoglobin (Hb A1C)  . POCT urinalysis dipstick    Meds ordered this encounter  Medications  . triamterene-hydrochlorothiazide (MAXZIDE) 75-50 MG tablet    Sig: Take 0.5 tablets by mouth daily.    Dispense:  45 tablet    Refill:  1    **Patient requests 90 days supply**  . atorvastatin (LIPITOR) 20 MG tablet    Sig: TAKE 1 TABLET(20 MG) BY MOUTH DAILY    Dispense:  90 tablet    Refill:  3    I personally performed the services described in this documentation, which was scribed in my presence. The recorded information has been reviewed and considered, and addended by me as needed.   Norberto SorensonEva Shaw, M.D.  [facility] at [facility]   [address] [place], [place] [address] [phone] phone [phone] fax  11/10/17 9:53 PM

## 2017-11-10 LAB — COMPREHENSIVE METABOLIC PANEL
A/G RATIO: 1.6 (ref 1.2–2.2)
ALBUMIN: 4.2 g/dL (ref 3.6–4.8)
ALT: 18 IU/L (ref 0–32)
AST: 22 IU/L (ref 0–40)
Alkaline Phosphatase: 100 IU/L (ref 39–117)
BUN / CREAT RATIO: 18 (ref 12–28)
BUN: 16 mg/dL (ref 8–27)
Bilirubin Total: 0.4 mg/dL (ref 0.0–1.2)
CO2: 26 mmol/L (ref 20–29)
CREATININE: 0.88 mg/dL (ref 0.57–1.00)
Calcium: 9.4 mg/dL (ref 8.7–10.3)
Chloride: 105 mmol/L (ref 96–106)
GFR calc non Af Amer: 68 mL/min/{1.73_m2} (ref >59)
GFR, EST AFRICAN AMERICAN: 79 mL/min/{1.73_m2} (ref >59)
Globulin, Total: 2.7 g/dL (ref 1.5–4.5)
Glucose: 96 mg/dL (ref 65–99)
POTASSIUM: 4.2 mmol/L (ref 3.5–5.2)
SODIUM: 144 mmol/L (ref 134–144)
TOTAL PROTEIN: 6.9 g/dL (ref 6.0–8.5)

## 2017-11-10 LAB — LIPID PANEL
CHOL/HDL RATIO: 2.9 ratio (ref 0.0–4.4)
Cholesterol, Total: 174 mg/dL (ref 100–199)
HDL: 61 mg/dL (ref >39)
LDL CALC: 99 mg/dL (ref 0–99)
TRIGLYCERIDES: 72 mg/dL (ref 0–149)
VLDL Cholesterol Cal: 14 mg/dL (ref 5–40)

## 2017-11-16 ENCOUNTER — Ambulatory Visit: Payer: Self-pay | Admitting: Family Medicine

## 2017-11-16 NOTE — Progress Notes (Deleted)
  No chief complaint on file.   HPI  4 review of systems  Past Medical History:  Diagnosis Date  . Arthritis   . Hypertension     Current Outpatient Medications  Medication Sig Dispense Refill  . atorvastatin (LIPITOR) 20 MG tablet TAKE 1 TABLET(20 MG) BY MOUTH DAILY 90 tablet 3  . calcium-vitamin D (OSCAL WITH D) 500-200 MG-UNIT tablet Take 1 tablet by mouth daily with breakfast. 30 tablet 0  . gabapentin (NEURONTIN) 300 MG capsule Take 1 capsule (300 mg total) by mouth 3 (three) times daily. 90 capsule 11  . triamterene-hydrochlorothiazide (MAXZIDE) 75-50 MG tablet Take 0.5 tablets by mouth daily. 45 tablet 1   No current facility-administered medications for this visit.     Allergies:  Allergies  Allergen Reactions  . Ibuprofen Nausea Only    Past Surgical History:  Procedure Laterality Date  . ABDOMINAL HYSTERECTOMY      Social History   Socioeconomic History  . Marital status: Married    Spouse name: Not on file  . Number of children: Not on file  . Years of education: Not on file  . Highest education level: Not on file  Social Needs  . Financial resource strain: Not on file  . Food insecurity - worry: Not on file  . Food insecurity - inability: Not on file  . Transportation needs - medical: Not on file  . Transportation needs - non-medical: Not on file  Occupational History  . Not on file  Tobacco Use  . Smoking status: Never Smoker  . Smokeless tobacco: Never Used  Substance and Sexual Activity  . Alcohol use: No  . Drug use: No  . Sexual activity: No  Other Topics Concern  . Not on file  Social History Narrative  . Not on file    Family History  Problem Relation Age of Onset  . Diabetes Father   . Hyperlipidemia Father   . Hypertension Father   . Diabetes Paternal Grandfather   . Diabetes Sister   . Hypertension Sister      ROS Review of Systems See HPI Constitution: No fevers or chills No malaise No diaphoresis Skin: No rash or  itching Eyes: no blurry vision, no double vision GU: no dysuria or hematuria Neuro: no dizziness or headaches * all others reviewed and negative   Objective: There were no vitals filed for this visit.  Physical Exam  Assessment and Plan There are no diagnoses linked to this encounter.   Octavis Sheeler P [employer]

## 2018-02-11 ENCOUNTER — Encounter: Payer: Self-pay | Admitting: Family Medicine

## 2018-02-18 DIAGNOSIS — E119 Type 2 diabetes mellitus without complications: Secondary | ICD-10-CM | POA: Diagnosis not present

## 2018-02-18 DIAGNOSIS — H01021 Squamous blepharitis right upper eyelid: Secondary | ICD-10-CM | POA: Diagnosis not present

## 2018-02-18 DIAGNOSIS — H40013 Open angle with borderline findings, low risk, bilateral: Secondary | ICD-10-CM | POA: Diagnosis not present

## 2018-02-18 DIAGNOSIS — H2513 Age-related nuclear cataract, bilateral: Secondary | ICD-10-CM | POA: Diagnosis not present

## 2018-02-18 LAB — HM DIABETES EYE EXAM

## 2018-04-02 ENCOUNTER — Encounter: Payer: Self-pay | Admitting: *Deleted

## 2018-05-05 ENCOUNTER — Telehealth: Payer: Self-pay | Admitting: Family Medicine

## 2018-05-05 NOTE — Telephone Encounter (Signed)
Copied from CRM [id number]. Topic: Quick Communication - See Telephone Encounter >> May 05, 2018  9:31 AM Lenoria ChimeBeasley, Denise S wrote: CRM for notification. See Telephone encounter for: 05/05/18. Pt got a letter about getting her mammogram. She needs to know where she calls to get this scheduled or does someone in the office schedule it for her

## 2018-05-06 ENCOUNTER — Other Ambulatory Visit: Payer: Self-pay | Admitting: Family Medicine

## 2018-05-06 DIAGNOSIS — Z1231 Encounter for screening mammogram for malignant neoplasm of breast: Secondary | ICD-10-CM

## 2018-05-06 NOTE — Telephone Encounter (Signed)
Please inform patient this is the letter that was sent with a number to call for her mammogram.   Our records indicate that based on your breast imaging examination performed on 04/25/17, it is time to schedule your annual mammogram.  Please call [facility] at [phone] to schedule your appointment.  If you have already scheduled your appointment, please disregard this letter.  Your record will be updated when your mammography report is complete.  If you had this imaging somewhere else, please let us know so we can update our records.

## 2018-05-09 ENCOUNTER — Ambulatory Visit (HOSPITAL_BASED_OUTPATIENT_CLINIC_OR_DEPARTMENT_OTHER)
Admission: RE | Admit: 2018-05-09 | Discharge: 2018-05-09 | Disposition: A | Discharge status: Home / Self Care | Payer: BLUE CROSS/BLUE SHIELD | Source: Ambulatory Visit | Attending: Family Medicine | Admitting: Family Medicine

## 2018-05-09 DIAGNOSIS — Z1231 Encounter for screening mammogram for malignant neoplasm of breast: Secondary | ICD-10-CM | POA: Insufficient documentation

## 2018-06-05 ENCOUNTER — Other Ambulatory Visit: Payer: Self-pay | Admitting: Family Medicine

## 2018-06-05 DIAGNOSIS — I10 Essential (primary) hypertension: Secondary | ICD-10-CM

## 2018-06-05 NOTE — Telephone Encounter (Signed)
Refill of maxzide  LOV 11/09/17 Dr. Clelia CroftShaw  [facility]  [facility]  [facility]ill   Called pt in attempt to schedule F/U appt re: hypertension. Pt states needs evening appt. Requests with Dr. Clelia CroftShaw only; made aware Dr. Clelia CroftShaw on leave until 07/22/18.  States she will have her daughter (Dr. Creta LevinStallings) assist her with making appt.

## 2018-06-27 ENCOUNTER — Ambulatory Visit: Payer: BLUE CROSS/BLUE SHIELD | Admitting: Emergency Medicine

## 2018-06-30 ENCOUNTER — Other Ambulatory Visit: Payer: Self-pay | Admitting: Family Medicine

## 2018-06-30 DIAGNOSIS — I10 Essential (primary) hypertension: Secondary | ICD-10-CM

## 2018-06-30 MED ORDER — PREDNISONE 10 MG (21) PO TBPK: ORAL_TABLET | ORAL | 0 refills | Status: DC

## 2018-06-30 NOTE — Telephone Encounter (Signed)
Requested medication (s) are due for refill today:   Yes  Requested medication (s) are on the active medication list:   Yes  Future visit scheduled:   No   PCP:  Clelia Croft   Last ordered: 06/06/18  #45  0 refills    Was given this refill and notified she needed an appt for future refills in Sept.   Requested Prescriptions  Pending Prescriptions Disp Refills   triamterene-hydrochlorothiazide (MAXZIDE) 75-50 MG tablet [Pharmacy Med Name: TRIAMTERENE 75MG / HCTZ 50MG  TABLETS] 45 tablet 1    Sig: TAKE 1/2 TABLET BY MOUTH DAILY     Cardiovascular: Diuretic Combos Failed - 06/30/2018 11:52 AM      Failed - Valid encounter within last 6 months    Recent Outpatient Visits          7 months ago Type 2 diabetes mellitus without complication, without long-term current use of insulin (HCC)   [facility], Levell July, MD   10 months ago Wax in ear   [facility] at [facility], [place] D, PA-C   1 year ago Essential hypertension   [facility], Levell July, MD   2 years ago Essential hypertension   [facility] at [facility], Marolyn Hammock, PA-C             Passed - K in normal range and within 360 days    Potassium  Date Value Ref Range Status  11/09/2017 4.2 3.5 - 5.2 mmol/L Final         Passed - Na in normal range and within 360 days    Sodium  Date Value Ref Range Status  11/09/2017 144 134 - 144 mmol/L Final         Passed - Cr in normal range and within 360 days    Creat  Date Value Ref Range Status  05/23/2016 0.74 0.50 - 0.99 mg/dL Final    Comment:      For patients > or = 69 years of age: The upper reference limit for Creatinine is approximately 13% higher for people identified as African-American.      Creatinine, Ser  Date Value Ref Range Status  11/09/2017 0.88 0.57 - 1.00 mg/dL Final         Passed - Ca in normal range and within 360 days    Calcium  Date Value Ref Range Status  11/09/2017 9.4 8.7 - 10.3 mg/dL Final         Passed -  Last BP in normal range    BP Readings from Last 1 Encounters:  11/09/17 118/68

## 2018-07-28 ENCOUNTER — Other Ambulatory Visit: Payer: Self-pay | Admitting: Family Medicine

## 2018-07-28 MED ORDER — HYDROCORTISONE 2.5 % EX OINT: TOPICAL_OINTMENT | Freq: Two times a day (BID) | CUTANEOUS | 0 refills | Status: AC

## 2018-12-08 ENCOUNTER — Other Ambulatory Visit: Payer: Self-pay | Admitting: Family Medicine

## 2018-12-08 MED ORDER — ATORVASTATIN CALCIUM 20 MG PO TABS: ORAL_TABLET | ORAL | 0 refills | Status: DC

## 2018-12-11 ENCOUNTER — Other Ambulatory Visit: Payer: Self-pay | Admitting: Family Medicine

## 2018-12-11 DIAGNOSIS — I10 Essential (primary) hypertension: Secondary | ICD-10-CM

## 2018-12-11 MED ORDER — TRIAMTERENE-HCTZ 75-50 MG PO TABS: 0.5000 | ORAL_TABLET | Freq: Every day | ORAL | 1 refills | Status: DC

## 2019-02-06 ENCOUNTER — Ambulatory Visit (INDEPENDENT_AMBULATORY_CARE_PROVIDER_SITE_OTHER): Payer: BLUE CROSS/BLUE SHIELD | Admitting: Family Medicine

## 2019-02-06 ENCOUNTER — Other Ambulatory Visit: Payer: Self-pay

## 2019-02-06 DIAGNOSIS — E119 Type 2 diabetes mellitus without complications: Secondary | ICD-10-CM | POA: Diagnosis not present

## 2019-02-06 DIAGNOSIS — E785 Hyperlipidemia, unspecified: Secondary | ICD-10-CM | POA: Diagnosis not present

## 2019-02-06 LAB — POCT GLYCOSYLATED HEMOGLOBIN (HGB A1C): Hemoglobin A1C: 6.8 % — AB (ref 4.0–5.6)

## 2019-02-06 NOTE — Progress Notes (Signed)
Labs Only

## 2019-02-07 LAB — CMP14+EGFR
ALT: 21 IU/L (ref 0–32)
AST: 21 IU/L (ref 0–40)
Albumin/Globulin Ratio: 2 (ref 1.2–2.2)
Albumin: 4.3 g/dL (ref 3.8–4.8)
Alkaline Phosphatase: 103 IU/L (ref 39–117)
BUN/Creatinine Ratio: 19 (ref 12–28)
BUN: 17 mg/dL (ref 8–27)
Bilirubin Total: 0.3 mg/dL (ref 0.0–1.2)
CO2: 28 mmol/L (ref 20–29)
Calcium: 9.3 mg/dL (ref 8.7–10.3)
Chloride: 101 mmol/L (ref 96–106)
Creatinine, Ser: 0.88 mg/dL (ref 0.57–1.00)
GFR calc Af Amer: 78 mL/min/{1.73_m2} (ref >59)
GFR calc non Af Amer: 68 mL/min/{1.73_m2} (ref >59)
Globulin, Total: 2.1 g/dL (ref 1.5–4.5)
Glucose: 104 mg/dL — ABNORMAL HIGH (ref 65–99)
Potassium: 3.8 mmol/L (ref 3.5–5.2)
Sodium: 144 mmol/L (ref 134–144)
Total Protein: 6.4 g/dL (ref 6.0–8.5)

## 2019-02-07 LAB — CBC WITH DIFFERENTIAL/PLATELET
Basophils Absolute: 0 10*3/uL (ref 0.0–0.2)
Basos: 1 %
EOS (ABSOLUTE): 0.2 10*3/uL (ref 0.0–0.4)
Eos: 3 %
Hematocrit: 39.7 % (ref 34.0–46.6)
Hemoglobin: 12.5 g/dL (ref 11.1–15.9)
Immature Grans (Abs): 0 10*3/uL (ref 0.0–0.1)
Immature Granulocytes: 0 %
Lymphocytes Absolute: 2 10*3/uL (ref 0.7–3.1)
Lymphs: 33 %
MCH: 27 pg (ref 26.6–33.0)
MCHC: 31.5 g/dL (ref 31.5–35.7)
MCV: 86 fL (ref 79–97)
Monocytes Absolute: 0.5 10*3/uL (ref 0.1–0.9)
Monocytes: 8 %
Neutrophils Absolute: 3.3 10*3/uL (ref 1.4–7.0)
Neutrophils: 55 %
Platelets: 236 10*3/uL (ref 150–450)
RBC: 4.63 x10E6/uL (ref 3.77–5.28)
RDW: 13.5 % (ref 11.7–15.4)
WBC: 6 10*3/uL (ref 3.4–10.8)

## 2019-02-07 LAB — LIPID PANEL
Chol/HDL Ratio: 3 ratio (ref 0.0–4.4)
Cholesterol, Total: 175 mg/dL (ref 100–199)
HDL: 58 mg/dL (ref >39)
LDL Calculated: 102 mg/dL — ABNORMAL HIGH (ref 0–99)
Triglycerides: 73 mg/dL (ref 0–149)
VLDL Cholesterol Cal: 15 mg/dL (ref 5–40)

## 2019-02-10 ENCOUNTER — Other Ambulatory Visit: Payer: Self-pay

## 2019-02-10 ENCOUNTER — Encounter: Payer: Self-pay | Admitting: Emergency Medicine

## 2019-02-10 ENCOUNTER — Ambulatory Visit (INDEPENDENT_AMBULATORY_CARE_PROVIDER_SITE_OTHER): Payer: BLUE CROSS/BLUE SHIELD | Admitting: Emergency Medicine

## 2019-02-10 VITALS — Ht 65.0 in | Wt 162.0 lb

## 2019-02-10 DIAGNOSIS — E1169 Type 2 diabetes mellitus with other specified complication: Secondary | ICD-10-CM

## 2019-02-10 DIAGNOSIS — E119 Type 2 diabetes mellitus without complications: Secondary | ICD-10-CM

## 2019-02-10 DIAGNOSIS — E1159 Type 2 diabetes mellitus with other circulatory complications: Secondary | ICD-10-CM

## 2019-02-10 DIAGNOSIS — I10 Essential (primary) hypertension: Secondary | ICD-10-CM | POA: Diagnosis not present

## 2019-02-10 DIAGNOSIS — E785 Hyperlipidemia, unspecified: Secondary | ICD-10-CM | POA: Diagnosis not present

## 2019-02-10 DIAGNOSIS — I152 Hypertension secondary to endocrine disorders: Secondary | ICD-10-CM

## 2019-02-10 MED ORDER — ATORVASTATIN CALCIUM 20 MG PO TABS: ORAL_TABLET | ORAL | 3 refills | Status: DC

## 2019-02-10 MED ORDER — TRIAMTERENE-HCTZ 75-50 MG PO TABS: 0.5000 | ORAL_TABLET | Freq: Every day | ORAL | 3 refills | Status: DC

## 2019-02-10 MED ORDER — BENAZEPRIL HCL 5 MG PO TABS: 5.0000 mg | ORAL_TABLET | Freq: Every day | ORAL | 3 refills | Status: DC

## 2019-02-10 NOTE — Patient Instructions (Signed)
° ° ° °  If you have lab work done today you will be contacted with your lab results within the next 2 weeks.  If you have not heard from us then please contact us. The fastest way to get your results is to register for My Chart. ° ° °IF you received an x-ray today, you will receive an invoice from [facility]. Please contact [facility] at [phone] with questions or concerns regarding your invoice.  ° °IF you received labwork today, you will receive an invoice from [facility]. Please contact [facility] at [phone] with questions or concerns regarding your invoice.  ° °Our billing staff will not be able to assist you with questions regarding bills from these companies. ° °You will be contacted with the lab results as soon as they are available. The fastest way to get your results is to activate your My Chart account. Instructions are located on the last page of this paperwork. If you have not heard from us regarding the results in 2 weeks, please contact this office. °  ° ° ° °

## 2019-02-10 NOTE — Progress Notes (Signed)
Lab Results  Component Value Date   HGBA1C 6.8 (A) 02/06/2019      Telemedicine Encounter- SOAP NOTE Established Patient  This telephone encounter was conducted with the patient's (or proxy's) verbal consent via audio telecommunications: yes/no: Yes Patient was instructed to have this encounter in a suitably private space; and to only have persons present to whom they give permission to participate. In addition, patient identity was confirmed by use of name plus two identifiers (DOB and address).  I discussed the limitations, risks, security and privacy concerns of performing an evaluation and management service by telephone and the availability of in person appointments. I also discussed with the patient that there may be a patient responsible charge related to this service. The patient expressed understanding and agreed to proceed.  I spent a total of TIME; 0 MIN TO 60 MIN: 15 minutes talking with the patient or their proxy.  Chief Complaint  Patient presents with  . Diabetes    follow up  . Medication Refill    needs refills on atorvastatin and triamterene-HCTZ and also states she needs a refill on benazepril.     Subjective   Hannah Copeland is a 68 y.o. female established patient. Telephone visit today for follow-up of hypertension and blood work that she had done on 02/06/2019 with the following results: Recent Results (from the past 2160 hour(s))  Lipid panel     Status: Abnormal   Collection Time: 02/06/19  9:55 AM  Result Value Ref Range   Cholesterol, Total 175 100 - 199 mg/dL   Triglycerides 73 0 - 149 mg/dL   HDL 58 >39 mg/dL   VLDL Cholesterol Cal 15 5 - 40 mg/dL   LDL Calculated 102 (H) 0 - 99 mg/dL   Chol/HDL Ratio 3.0 0.0 - 4.4 ratio    Comment:                                   T. Chol/HDL Ratio                                             Men  Women                               1/2 Avg.Risk  3.4    3.3                                   Avg.Risk  5.0    4.4                          2X Avg.Risk  9.6    7.1                                3X Avg.Risk 23.4   11.0   CMP14+EGFR     Status: Abnormal   Collection Time: 02/06/19  9:55 AM  Result Value Ref Range   Glucose 104 (H) 65 - 99 mg/dL   BUN 17 8 - 27 mg/dL   Creatinine, Ser 0.88 0.57 - 1.00 mg/dL   GFR calc non Af  Amer 68 >59 mL/min/1.73   GFR calc Af Amer 78 >59 mL/min/1.73   BUN/Creatinine Ratio 19 12 - 28   Sodium 144 134 - 144 mmol/L   Potassium 3.8 3.5 - 5.2 mmol/L   Chloride 101 96 - 106 mmol/L   CO2 28 20 - 29 mmol/L   Calcium 9.3 8.7 - 10.3 mg/dL   Total Protein 6.4 6.0 - 8.5 g/dL   Albumin 4.3 3.8 - 4.8 g/dL   Globulin, Total 2.1 1.5 - 4.5 g/dL   Albumin/Globulin Ratio 2.0 1.2 - 2.2   Bilirubin Total 0.3 0.0 - 1.2 mg/dL   Alkaline Phosphatase 103 39 - 117 IU/L   AST 21 0 - 40 IU/L   ALT 21 0 - 32 IU/L  CBC with Differential     Status: None   Collection Time: 02/06/19  9:55 AM  Result Value Ref Range   WBC 6.0 3.4 - 10.8 x10E3/uL   RBC 4.63 3.77 - 5.28 x10E6/uL   Hemoglobin 12.5 11.1 - 15.9 g/dL   Hematocrit 39.7 34.0 - 46.6 %   MCV 86 79 - 97 fL   MCH 27.0 26.6 - 33.0 pg   MCHC 31.5 31.5 - 35.7 g/dL   RDW 13.5 11.7 - 15.4 %   Platelets 236 150 - 450 x10E3/uL   Neutrophils 55 Not Estab. %   Lymphs 33 Not Estab. %   Monocytes 8 Not Estab. %   Eos 3 Not Estab. %   Basos 1 Not Estab. %   Neutrophils Absolute 3.3 1.4 - 7.0 x10E3/uL   Lymphocytes Absolute 2.0 0.7 - 3.1 x10E3/uL   Monocytes Absolute 0.5 0.1 - 0.9 x10E3/uL   EOS (ABSOLUTE) 0.2 0.0 - 0.4 x10E3/uL   Basophils Absolute 0.0 0.0 - 0.2 x10E3/uL   Immature Granulocytes 0 Not Estab. %   Immature Grans (Abs) 0.0 0.0 - 0.1 x10E3/uL  POCT glycosylated hemoglobin (Hb A1C)     Status: Abnormal   Collection Time: 02/06/19 10:46 AM  Result Value Ref Range   Hemoglobin A1C 6.8 (A) 4.0 - 5.6 %   HbA1c POC (<> result, manual entry)     HbA1c, POC (prediabetic range)     HbA1c, POC (controlled diabetic range)      Has no complaints or medical concerns.  States that she was also taking benazepril 5 mg daily and is requesting a refill.  Also needs refills on atorvastatin and Maxide.  HPI   Patient Active Problem List   Diagnosis Date Noted  . Hypercholesterolemia 05/25/2016  . DJD (degenerative joint disease), lumbosacral 05/23/2016  . Osteopenia 05/23/2016  . HTN (hypertension) 05/23/2016  . Prediabetes 05/23/2016    Past Medical History:  Diagnosis Date  . Arthritis   . Hypertension     Current Outpatient Medications  Medication Sig Dispense Refill  . atorvastatin (LIPITOR) 20 MG tablet TAKE 1 TABLET(20 MG) BY MOUTH DAILY 90 tablet 3  . calcium-vitamin D (OSCAL WITH D) 500-200 MG-UNIT tablet Take 1 tablet by mouth daily with breakfast. 30 tablet 0  . hydrocortisone 2.5 % ointment Apply topically 2 (two) times daily. 300 g 0  . triamterene-hydrochlorothiazide (MAXZIDE) 75-50 MG tablet Take 0.5 tablets by mouth daily. 45 tablet 3  . benazepril (LOTENSIN) 5 MG tablet Take 1 tablet (5 mg total) by mouth daily. 90 tablet 3  . gabapentin (NEURONTIN) 300 MG capsule Take 1 capsule (300 mg total) by mouth 3 (three) times daily. (Patient not taking: Reported on 02/10/2019) 90 capsule 11  No current facility-administered medications for this visit.     Allergies  Allergen Reactions  . Ibuprofen Nausea Only    Social History   Socioeconomic History  . Marital status: Married    Spouse name: Not on file  . Number of children: Not on file  . Years of education: Not on file  . Highest education level: Not on file  Occupational History  . Not on file  Social Needs  . Financial resource strain: Not on file  . Food insecurity:    Worry: Not on file    Inability: Not on file  . Transportation needs:    Medical: Not on file    Non-medical: Not on file  Tobacco Use  . Smoking status: Never Smoker  . Smokeless tobacco: Never Used  Substance and Sexual Activity  . Alcohol use: No  . Drug  use: No  . Sexual activity: Never  Lifestyle  . Physical activity:    Days per week: Not on file    Minutes per session: Not on file  . Stress: Not on file  Relationships  . Social connections:    Talks on phone: Not on file    Gets together: Not on file    Attends religious service: Not on file    Active member of club or organization: Not on file    Attends meetings of clubs or organizations: Not on file    Relationship status: Not on file  . Intimate partner violence:    Fear of current or ex partner: Not on file    Emotionally abused: Not on file    Physically abused: Not on file    Forced sexual activity: Not on file  Other Topics Concern  . Not on file  Social History Narrative  . Not on file    ROS  Objective   Vitals as reported by the patient: Today's Vitals   02/10/19 1340  Weight: 162 lb (73.5 kg)  Height: _0  (1.651 m)  Awake and oriented x3 in no apparent respiratory distress.  Hannah Copeland was seen today for diabetes and medication refill.  Diagnoses and all orders for this visit:  Dyslipidemia -     Lipid panel  Type 2 diabetes mellitus without complication, without long-term current use of insulin (HCC) -     CMP14+EGFR -     POCT glycosylated hemoglobin (Hb A1C) -     CBC with Differential  Essential hypertension -     triamterene-hydrochlorothiazide (MAXZIDE) 75-50 MG tablet; Take 0.5 tablets by mouth daily.  Other orders -     atorvastatin (LIPITOR) 20 MG tablet; TAKE 1 TABLET(20 MG) BY MOUTH DAILY -     benazepril (LOTENSIN) 5 MG tablet; Take 1 tablet (5 mg total) by mouth daily.  Clinically stable.  No medical concerns identified during this visit.  Patient restarted taking benazepril 5 mg a day with success. Medications refilled.  Diet and nutrition along with the benefits of regular exercise discussed with patient. Blood results discussed with patient in detail. Office visit in 2 to 3 months.   I discussed the assessment and treatment  plan with the patient. The patient was provided an opportunity to ask questions and all were answered. The patient agreed with the plan and demonstrated an understanding of the instructions.   The patient was advised to call back or seek an in-person evaluation if the symptoms worsen or if the condition fails to improve as anticipated.  I provided 15 minutes of  non-face-to-face time during this encounter.  Horald Pollen, MD  Primary Care at [facility]

## 2019-02-24 DIAGNOSIS — H2513 Age-related nuclear cataract, bilateral: Secondary | ICD-10-CM | POA: Diagnosis not present

## 2019-02-24 DIAGNOSIS — H40013 Open angle with borderline findings, low risk, bilateral: Secondary | ICD-10-CM | POA: Diagnosis not present

## 2019-02-24 DIAGNOSIS — H04123 Dry eye syndrome of bilateral lacrimal glands: Secondary | ICD-10-CM | POA: Diagnosis not present

## 2019-02-24 DIAGNOSIS — E119 Type 2 diabetes mellitus without complications: Secondary | ICD-10-CM | POA: Diagnosis not present

## 2019-03-19 ENCOUNTER — Other Ambulatory Visit: Payer: Self-pay | Admitting: Family Medicine

## 2019-03-19 NOTE — Telephone Encounter (Signed)
Requested Prescriptions  Pending Prescriptions Disp Refills  . atorvastatin (LIPITOR) 20 MG tablet [Pharmacy Med Name: ATORVASTATIN 20MG  TABLETS] 90 tablet 3    Sig: TAKE 1 TABLET(20 MG) BY MOUTH DAILY     Cardiovascular:  Antilipid - Statins Failed - 03/19/2019  1:42 PM      Failed - LDL in normal range and within 360 days    LDL Calculated  Date Value Ref Range Status  02/06/2019 102 (H) 0 - 99 mg/dL Final         Failed - Valid encounter within last 12 months    Recent Outpatient Visits          1 month ago Essential hypertension   [facility] at [place], [place], MD   1 month ago Type 2 diabetes mellitus without complication, without long-term current use of insulin [facility])   [facility] [place], Ranell Patrick, MD   1 year ago Type 2 diabetes mellitus without complication, without long-term current use of insulin [facility])   [facility] at [facility], Laurey Arrow, MD   1 year ago Wax in ear   [facility] at [place], [place] D, PA-C   2 years ago Essential hypertension   [facility], Laurey Arrow, MD             Passed - Total Cholesterol in normal range and within 360 days    Cholesterol, Total  Date Value Ref Range Status  02/06/2019 175 100 - 199 mg/dL Final         Passed - HDL in normal range and within 360 days    HDL  Date Value Ref Range Status  02/06/2019 58 >39 mg/dL Final         Passed - Triglycerides in normal range and within 360 days    Triglycerides  Date Value Ref Range Status  02/06/2019 73 0 - 149 mg/dL Final         Passed - Patient is not pregnant

## 2019-06-11 DIAGNOSIS — E1165 Type 2 diabetes mellitus with hyperglycemia: Secondary | ICD-10-CM | POA: Diagnosis not present

## 2019-06-11 DIAGNOSIS — E782 Mixed hyperlipidemia: Secondary | ICD-10-CM | POA: Diagnosis not present

## 2019-06-11 DIAGNOSIS — I10 Essential (primary) hypertension: Secondary | ICD-10-CM | POA: Diagnosis not present

## 2019-06-11 DIAGNOSIS — Z760 Encounter for issue of repeat prescription: Secondary | ICD-10-CM | POA: Diagnosis not present

## 2019-06-25 DIAGNOSIS — E1165 Type 2 diabetes mellitus with hyperglycemia: Secondary | ICD-10-CM | POA: Diagnosis not present

## 2019-07-15 ENCOUNTER — Other Ambulatory Visit (HOSPITAL_BASED_OUTPATIENT_CLINIC_OR_DEPARTMENT_OTHER): Payer: Self-pay | Admitting: Family Medicine

## 2019-07-15 DIAGNOSIS — Z1231 Encounter for screening mammogram for malignant neoplasm of breast: Secondary | ICD-10-CM

## 2019-07-16 ENCOUNTER — Ambulatory Visit (HOSPITAL_BASED_OUTPATIENT_CLINIC_OR_DEPARTMENT_OTHER)
Admission: RE | Admit: 2019-07-16 | Discharge: 2019-07-16 | Disposition: A | Discharge status: Home / Self Care | Payer: BC Managed Care – PPO | Source: Ambulatory Visit | Attending: Family Medicine | Admitting: Family Medicine

## 2019-07-16 ENCOUNTER — Other Ambulatory Visit: Payer: Self-pay

## 2019-07-16 DIAGNOSIS — Z1231 Encounter for screening mammogram for malignant neoplasm of breast: Secondary | ICD-10-CM | POA: Diagnosis not present

## 2019-10-05 ENCOUNTER — Ambulatory Visit: Payer: BLUE CROSS/BLUE SHIELD | Admitting: Registered Nurse

## 2019-10-05 ENCOUNTER — Encounter: Payer: Self-pay | Admitting: Registered Nurse

## 2019-10-05 ENCOUNTER — Other Ambulatory Visit: Payer: Self-pay

## 2019-10-05 VITALS — BP 144/79 | HR 80 | Temp 97.3°F | Resp 17 | Ht 65.0 in | Wt 164.2 lb

## 2019-10-05 DIAGNOSIS — E785 Hyperlipidemia, unspecified: Secondary | ICD-10-CM | POA: Diagnosis not present

## 2019-10-05 DIAGNOSIS — Z23 Encounter for immunization: Secondary | ICD-10-CM | POA: Diagnosis not present

## 2019-10-05 DIAGNOSIS — E119 Type 2 diabetes mellitus without complications: Secondary | ICD-10-CM

## 2019-10-05 DIAGNOSIS — I10 Essential (primary) hypertension: Secondary | ICD-10-CM | POA: Diagnosis not present

## 2019-10-05 DIAGNOSIS — Z135 Encounter for screening for eye and ear disorders: Secondary | ICD-10-CM

## 2019-10-05 DIAGNOSIS — H6503 Acute serous otitis media, bilateral: Secondary | ICD-10-CM

## 2019-10-05 DIAGNOSIS — H6120 Impacted cerumen, unspecified ear: Secondary | ICD-10-CM

## 2019-10-05 MED ORDER — HYDROCORTISONE-ACETIC ACID 1-2 % OT SOLN: 3.0000 [drp] | Freq: Two times a day (BID) | OTIC | 0 refills | Status: AC

## 2019-10-05 MED ORDER — AMOXICILLIN 875 MG PO TABS: 875.0000 mg | ORAL_TABLET | Freq: Two times a day (BID) | ORAL | 0 refills | Status: DC

## 2019-10-05 NOTE — Patient Instructions (Addendum)
  Hannah Copeland   It looks like you have a mild inner ear infection  Please take Amoxicillin twice daily for the next five days. Please use the ear drops provided for the next five days.  If your symptoms are worsening, or failing to improve by the end of this week, please let me know.  Thank you  Jari Sportsman, NP   If you have lab work done today you will be contacted with your lab results within the next 2 weeks.  If you have not heard from [place] then please contact us. The fastest way to get your results is to register for My Chart.   IF you received an x-ray today, you will receive an invoice from [facility]. Please contact [facility] at [phone] with questions or concerns regarding your invoice.   IF you received labwork today, you will receive an invoice from [facility]. Please contact [facility] at [phone] with questions or concerns regarding your invoice.   Our billing staff will not be able to assist you with questions regarding bills from these companies.  You will be contacted with the lab results as soon as they are available. The fastest way to get your results is to activate your My Chart account. Instructions are located on the last page of this paperwork. If you have not heard from [place] regarding the results in 2 weeks, please contact this office.

## 2019-10-05 NOTE — Progress Notes (Signed)
Acute Office Visit  Subjective:    Patient ID: Hannah Copeland, female    DOB: 1951/06/22, 69 y.o.   MRN: [id number]  Chief Complaint  Patient presents with  . Cerumen Impaction    have some wax built up in both ears ,and both ears have been popping patient states when she swallows she can feel it.    HPI Patient is in today for fullness in ears  Onset over the weekend. No drainage. Hearing mildly interrupted, but can still hear. No tinnitus. No recent URI. No recent abx use. This has happened before, has received lavage for impacted cerumen for similar symptoms.  Past Medical History:  Diagnosis Date  . Arthritis   . Hypertension     Past Surgical History:  Procedure Laterality Date  . ABDOMINAL HYSTERECTOMY      Family History  Problem Relation Age of Onset  . Diabetes Father   . Hyperlipidemia Father   . Hypertension Father   . Diabetes Paternal Grandfather   . Diabetes Sister   . Hypertension Sister     Social History   Socioeconomic History  . Marital status: Married    Spouse name: Not on file  . Number of children: Not on file  . Years of education: Not on file  . Highest education level: Not on file  Occupational History  . Not on file  Tobacco Use  . Smoking status: Never Smoker  . Smokeless tobacco: Never Used  Substance and Sexual Activity  . Alcohol use: No  . Drug use: No  . Sexual activity: Never  Other Topics Concern  . Not on file  Social History Narrative  . Not on file   Social Determinants of Health   Financial Resource Strain:   . Difficulty of Paying Living Expenses: Not on file  Food Insecurity:   . Worried About Charity fundraiser in the Last Year: Not on file  . Ran Out of Food in the Last Year: Not on file  Transportation Needs:   . Lack of Transportation (Medical): Not on file  . Lack of Transportation (Non-Medical): Not on file  Physical Activity:   . Days of Exercise per Week: Not on file  . Minutes of Exercise per  Session: Not on file  Stress:   . Feeling of Stress : Not on file  Social Connections:   . Frequency of Communication with Friends and Family: Not on file  . Frequency of Social Gatherings with Friends and Family: Not on file  . Attends Religious Services: Not on file  . Active Member of Clubs or Organizations: Not on file  . Attends Archivist Meetings: Not on file  . Marital Status: Not on file  Intimate Partner Violence:   . Fear of Current or Ex-Partner: Not on file  . Emotionally Abused: Not on file  . Physically Abused: Not on file  . Sexually Abused: Not on file    Outpatient Medications Prior to Visit  Medication Sig Dispense Refill  . atorvastatin (LIPITOR) 20 MG tablet TAKE 1 TABLET(20 MG) BY MOUTH DAILY 90 tablet 3  . gabapentin (NEURONTIN) 300 MG capsule Take 1 capsule (300 mg total) by mouth 3 (three) times daily. 90 capsule 11  . benazepril (LOTENSIN) 5 MG tablet Take 1 tablet (5 mg total) by mouth daily. 90 tablet 3  . calcium-vitamin D (OSCAL WITH D) 500-200 MG-UNIT tablet Take 1 tablet by mouth daily with breakfast. (Patient not taking: Reported on 10/05/2019) 30  tablet 0  . hydrocortisone 2.5 % ointment Apply topically 2 (two) times daily. (Patient not taking: Reported on 10/05/2019) 300 g 0  . triamterene-hydrochlorothiazide (MAXZIDE) 75-50 MG tablet Take 0.5 tablets by mouth daily. 45 tablet 3   No facility-administered medications prior to visit.    Allergies  Allergen Reactions  . Ibuprofen Nausea Only    Review of Systems  Constitutional: Negative.   HENT: Positive for ear pain and hearing loss. Negative for congestion, dental problem, drooling, ear discharge, facial swelling, mouth sores, nosebleeds, postnasal drip, rhinorrhea, sinus pressure, sinus pain, sneezing, sore throat, tinnitus, trouble swallowing and voice change.   Eyes: Negative.   Respiratory: Negative.   Cardiovascular: Negative.   Gastrointestinal: Negative.   Endocrine:  Negative.   Genitourinary: Negative.   Musculoskeletal: Negative.   Skin: Negative.   Allergic/Immunologic: Negative.   Neurological: Negative.   Hematological: Negative.   Psychiatric/Behavioral: Negative.   All other systems reviewed and are negative.      Objective:    Physical Exam Vitals and nursing note reviewed.  Constitutional:      General: She is not in acute distress.    Appearance: Normal appearance. She is normal weight. She is not ill-appearing, toxic-appearing or diaphoretic.  HENT:     Head: Normocephalic and atraumatic.     Right Ear: Decreased hearing noted. Tenderness present. No laceration or drainage. A middle ear effusion is present. There is no impacted cerumen. No foreign body. No mastoid tenderness. Tympanic membrane is bulging.     Left Ear: Decreased hearing noted. Tenderness present. No laceration or drainage. A middle ear effusion is present. There is no impacted cerumen. No foreign body. No mastoid tenderness. Tympanic membrane is bulging.  Neurological:     Mental Status: She is alert.     BP (!) 144/79   Pulse 80   Temp (!) 97.3 F (36.3 C) (Temporal)   Resp 17   Ht 5\' 5"  (1.651 m)   Wt 164 lb 3.2 oz (74.5 kg)   SpO2 96%   BMI 27.32 kg/m  Wt Readings from Last 3 Encounters:  10/05/19 164 lb 3.2 oz (74.5 kg)  02/10/19 162 lb (73.5 kg)  11/09/17 161 lb 6.4 oz (73.2 kg)    Health Maintenance Due  Topic Date Due  . Hepatitis C Screening  21-Jun-1951  . FOOT EXAM  02/07/1960  . URINE MICROALBUMIN  02/07/1960  . DEXA SCAN  02/07/2015  . OPHTHALMOLOGY EXAM  02/19/2019  . HEMOGLOBIN A1C  08/09/2019    There are no preventive care reminders to display for this patient.   Lab Results  Component Value Date   TSH 1.99 05/23/2016   Lab Results  Component Value Date   WBC 6.0 02/06/2019   HGB 12.5 02/06/2019   HCT 39.7 02/06/2019   MCV 86 02/06/2019   PLT 236 02/06/2019   Lab Results  Component Value Date   NA 144 02/06/2019   K  3.8 02/06/2019   CO2 28 02/06/2019   GLUCOSE 104 (H) 02/06/2019   BUN 17 02/06/2019   CREATININE 0.88 02/06/2019   BILITOT 0.3 02/06/2019   ALKPHOS 103 02/06/2019   AST 21 02/06/2019   ALT 21 02/06/2019   PROT 6.4 02/06/2019   ALBUMIN 4.3 02/06/2019   CALCIUM 9.3 02/06/2019   Lab Results  Component Value Date   CHOL 175 02/06/2019   Lab Results  Component Value Date   HDL 58 02/06/2019   Lab Results  Component Value Date  LDLCALC 102 (H) 02/06/2019   Lab Results  Component Value Date   TRIG 73 02/06/2019   Lab Results  Component Value Date   CHOLHDL 3.0 02/06/2019   Lab Results  Component Value Date   HGBA1C 6.8 (A) 02/06/2019       Assessment & Plan:   Problem List Items Addressed This Visit      Cardiovascular and Mediastinum   HTN (hypertension)    Other Visit Diagnoses    Type 2 diabetes mellitus without complication, without long-term current use of insulin (HCC)    -  Primary   Dyslipidemia       Need for prophylactic vaccination and inoculation against influenza       Screening for diabetic retinopathy       Need for prophylactic vaccination against Streptococcus pneumoniae (pneumococcus)       Need for Tdap vaccination       Impacted cerumen, unspecified laterality       Non-recurrent acute serous otitis media of both ears       Relevant Medications   amoxicillin (AMOXIL) 875 MG tablet   acetic acid-hydrocortisone (VOSOL-HC) OTIC solution       Meds ordered this encounter  Medications  . amoxicillin (AMOXIL) 875 MG tablet    Sig: Take 1 tablet (875 mg total) by mouth 2 (two) times daily.    Dispense:  20 tablet    Refill:  0    Order Specific Question:   Supervising Provider    Answer:   Collie Siad A [id number]  . acetic acid-hydrocortisone (VOSOL-HC) OTIC solution    Sig: Place 3 drops into both ears 2 (two) times daily.    Dispense:  10 mL    Refill:  0    Order Specific Question:   Supervising Provider    Answer:   Doristine Bosworth [id number]   PLAN  Appears as AOM  Some irritation in canals, but no major indications of otitis externa  Will give short course of amoxicillin and monitor for improvement  Will provide ear drops to use 2-3x daily for symptom relief  Return to clinic if symptoms worsen or fail to improve  Patient encouraged to call clinic with any questions, comments, or concerns.   Janeece Agee, NP

## 2019-11-15 ENCOUNTER — Ambulatory Visit: Payer: BC Managed Care – PPO | Attending: Internal Medicine

## 2019-11-15 DIAGNOSIS — Z23 Encounter for immunization: Secondary | ICD-10-CM

## 2019-11-15 NOTE — Progress Notes (Signed)
   Covid-19 Vaccination Clinic  Name:  Hannah Copeland    MRN: [id number] DOB: [birth year]  11/15/2019  [name] was observed post Covid-19 immunization for 15 minutes without incidence. She was provided with Vaccine Information Sheet and instruction to access the V-Safe system.   [name] was instructed to call 911 with any severe reactions post vaccine: Marland Kitchen Difficulty breathing  . Swelling of your face and throat  . A fast heartbeat  . A bad rash all over your body  . Dizziness and weakness    Immunizations Administered    Name Date Dose VIS Date Route   Pfizer COVID-19 Vaccine 11/15/2019 12:07 PM 0.3 mL 08/28/2019 Intramuscular   Manufacturer: [employer], [employer]   Lot: [id number]   NDC: [address]-9

## 2019-12-15 ENCOUNTER — Ambulatory Visit: Payer: Medicare Other | Attending: Internal Medicine

## 2019-12-15 DIAGNOSIS — Z23 Encounter for immunization: Secondary | ICD-10-CM

## 2019-12-15 NOTE — Progress Notes (Signed)
   Covid-19 Vaccination Clinic  Name:  Hannah Copeland    MRN: [id number] DOB: [birth year]  12/15/2019  [name] was observed post Covid-19 immunization for 15 minutes without incident. She was provided with Vaccine Information Sheet and instruction to access the V-Safe system.   [name] was instructed to call 911 with any severe reactions post vaccine: Marland Kitchen Difficulty breathing  . Swelling of face and throat  . A fast heartbeat  . A bad rash all over body  . Dizziness and weakness   Immunizations Administered    Name Date Dose VIS Date Route   Pfizer COVID-19 Vaccine 12/15/2019  4:34 PM 0.3 mL 08/28/2019 Intramuscular   Manufacturer: [employer], [employer]   Lot: [id number]   NDC: [address]-1

## 2020-02-25 ENCOUNTER — Other Ambulatory Visit: Payer: Self-pay | Admitting: Emergency Medicine

## 2020-02-25 NOTE — Telephone Encounter (Signed)
No further refills without office visit 

## 2020-02-25 NOTE — Telephone Encounter (Signed)
Please schedule patient a f/u appt for any further refills.Only 30 day supply has been sent

## 2020-02-26 NOTE — Telephone Encounter (Signed)
LVM detailing need for appt. For future refills

## 2020-03-01 DIAGNOSIS — E119 Type 2 diabetes mellitus without complications: Secondary | ICD-10-CM | POA: Diagnosis not present

## 2020-03-01 DIAGNOSIS — H25812 Combined forms of age-related cataract, left eye: Secondary | ICD-10-CM | POA: Diagnosis not present

## 2020-03-01 DIAGNOSIS — H40013 Open angle with borderline findings, low risk, bilateral: Secondary | ICD-10-CM | POA: Diagnosis not present

## 2020-03-01 DIAGNOSIS — H5203 Hypermetropia, bilateral: Secondary | ICD-10-CM | POA: Diagnosis not present

## 2020-03-01 DIAGNOSIS — H52203 Unspecified astigmatism, bilateral: Secondary | ICD-10-CM | POA: Diagnosis not present

## 2020-03-01 DIAGNOSIS — H2511 Age-related nuclear cataract, right eye: Secondary | ICD-10-CM | POA: Diagnosis not present

## 2020-03-01 DIAGNOSIS — H524 Presbyopia: Secondary | ICD-10-CM | POA: Diagnosis not present

## 2020-03-15 DIAGNOSIS — Z13820 Encounter for screening for osteoporosis: Secondary | ICD-10-CM | POA: Diagnosis not present

## 2020-03-15 DIAGNOSIS — E7801 Familial hypercholesterolemia: Secondary | ICD-10-CM | POA: Diagnosis not present

## 2020-03-15 DIAGNOSIS — D125 Benign neoplasm of sigmoid colon: Secondary | ICD-10-CM | POA: Diagnosis not present

## 2020-03-15 DIAGNOSIS — E1165 Type 2 diabetes mellitus with hyperglycemia: Secondary | ICD-10-CM | POA: Diagnosis not present

## 2020-03-15 DIAGNOSIS — Z0000 Encounter for general adult medical examination without abnormal findings: Secondary | ICD-10-CM | POA: Diagnosis not present

## 2020-03-16 DIAGNOSIS — D125 Benign neoplasm of sigmoid colon: Secondary | ICD-10-CM | POA: Diagnosis not present

## 2020-03-16 DIAGNOSIS — E7801 Familial hypercholesterolemia: Secondary | ICD-10-CM | POA: Diagnosis not present

## 2020-03-17 DIAGNOSIS — K5900 Constipation, unspecified: Secondary | ICD-10-CM | POA: Diagnosis not present

## 2020-03-17 DIAGNOSIS — Z1211 Encounter for screening for malignant neoplasm of colon: Secondary | ICD-10-CM | POA: Diagnosis not present

## 2020-03-18 ENCOUNTER — Other Ambulatory Visit: Payer: Self-pay | Admitting: Family Medicine

## 2020-03-18 DIAGNOSIS — Z13820 Encounter for screening for osteoporosis: Secondary | ICD-10-CM

## 2020-04-01 ENCOUNTER — Other Ambulatory Visit: Payer: Self-pay | Admitting: Emergency Medicine

## 2020-04-01 NOTE — Telephone Encounter (Signed)
Please schedule patient a f/u appt for medication refills and labs before any further refills

## 2020-04-01 NOTE — Telephone Encounter (Signed)
Requested medication (s) are due for refill today: yes  Requested medication (s) are on the active medication list: yes  Last refill:  02/25/2020  Future visit scheduled: no  Notes to clinic:  patient was notified that appointment needed but nothing scheduled Patient maybe under new provider care   Requested Prescriptions  Pending Prescriptions Disp Refills   benazepril (LOTENSIN) 5 MG tablet [Pharmacy Med Name: BENAZEPRIL 5MG  TABLETS] 30 tablet 0    Sig: TAKE 1 TABLET(5 MG) BY MOUTH DAILY      Cardiovascular:  ACE Inhibitors Failed - 04/01/2020 10:54 AM      Failed - Cr in normal range and within 180 days    Creat  Date Value Ref Range Status  05/23/2016 0.74 0.50 - 0.99 mg/dL Final    Comment:      For patients > or = 70 years of age: The upper reference limit for Creatinine is approximately 13% higher for people identified as African-American.      Creatinine, Ser  Date Value Ref Range Status  02/06/2019 0.88 0.57 - 1.00 mg/dL Final          Failed - K in normal range and within 180 days    Potassium  Date Value Ref Range Status  02/06/2019 3.8 3.5 - 5.2 mmol/L Final          Failed - Last BP in normal range    BP Readings from Last 1 Encounters:  10/05/19 (!) 144/79          Failed - Valid encounter within last 6 months    Recent Outpatient Visits           5 months ago Type 2 diabetes mellitus without complication, without long-term current use of insulin (HCC)   Primary Care at 10/07[facility]19, Shelbie Ammons, NP   1 year ago Essential hypertension   Primary Care at [place], [place], MD   1 year ago Type 2 diabetes mellitus without complication, without long-term current use of insulin [facility])   Primary Care at [facility], Sunday Shams, MD   2 years ago Type 2 diabetes mellitus without complication, without long-term current use of insulin [facility])   Primary Care at [facility], Etta Grandchild, MD   2 years ago Wax in ear   Primary Care at [place],  Cegdel D, [place]              Passed - Patient is not pregnant        atorvastatin (LIPITOR) 20 MG tablet [Pharmacy Med Name: ATORVASTATIN 20MG  TABLETS] 30 tablet 0    Sig: TAKE 1 TABLET(20 MG) BY MOUTH DAILY      Cardiovascular:  Antilipid - Statins Failed - 04/01/2020 10:54 AM      Failed - Total Cholesterol in normal range and within 360 days    Cholesterol, Total  Date Value Ref Range Status  02/06/2019 175 100 - 199 mg/dL Final          Failed - LDL in normal range and within 360 days    LDL Calculated  Date Value Ref Range Status  02/06/2019 102 (H) 0 - 99 mg/dL Final          Failed - HDL in normal range and within 360 days    HDL  Date Value Ref Range Status  02/06/2019 58 >39 mg/dL Final          Failed - Triglycerides in normal range and within 360 days  Triglycerides  Date Value Ref Range Status  02/06/2019 73 0 - 149 mg/dL Final          Failed - Valid encounter within last 12 months    Recent Outpatient Visits           5 months ago Type 2 diabetes mellitus without complication, without long-term current use of insulin (HCC)   Primary Care at [facility] Amm[facility], Gerlene Burdock, NP   1 year ago Essential hypertension   Primary Care at [place], Bud, MD   1 year ago Type 2 diabetes mellitus without complication, without long-term current use of insulin [facility])   Primary Care at [facility] Sh[facility], Asencion Partridge, MD   2 years ago Type 2 diabetes mellitus without complication, without long-term current use of insulin [facility])   Primary Care at Etta Grandchild, Levell July, MD   2 years ago Wax in ear   Primary Care at [place], Gerald Stabs, [place]              Passed - Patient is not pregnant

## 2020-04-01 NOTE — Telephone Encounter (Signed)
Called pt she is going out of the country  she will call went she get back

## 2020-04-02 DIAGNOSIS — Z20822 Contact with and (suspected) exposure to covid-19: Secondary | ICD-10-CM | POA: Diagnosis not present

## 2020-07-11 ENCOUNTER — Other Ambulatory Visit: Payer: Self-pay | Admitting: Family Medicine

## 2020-07-12 ENCOUNTER — Other Ambulatory Visit: Payer: Self-pay | Admitting: Family Medicine

## 2020-07-12 DIAGNOSIS — G252 Other specified forms of tremor: Secondary | ICD-10-CM

## 2020-07-13 ENCOUNTER — Other Ambulatory Visit: Payer: Self-pay

## 2020-07-13 ENCOUNTER — Ambulatory Visit (INDEPENDENT_AMBULATORY_CARE_PROVIDER_SITE_OTHER): Payer: Medicare Other | Admitting: Neurology

## 2020-07-13 ENCOUNTER — Ambulatory Visit
Admission: RE | Admit: 2020-07-13 | Discharge: 2020-07-13 | Disposition: A | Discharge status: Home / Self Care | Payer: Medicare Other | Source: Ambulatory Visit | Attending: Family Medicine | Admitting: Family Medicine

## 2020-07-13 ENCOUNTER — Encounter: Payer: Self-pay | Admitting: Neurology

## 2020-07-13 DIAGNOSIS — G20 Parkinson's disease: Secondary | ICD-10-CM

## 2020-07-13 DIAGNOSIS — G20A1 Parkinson's disease without dyskinesia, without mention of fluctuations: Secondary | ICD-10-CM

## 2020-07-13 DIAGNOSIS — G252 Other specified forms of tremor: Secondary | ICD-10-CM

## 2020-07-13 HISTORY — DX: Parkinson's disease: G20

## 2020-07-13 HISTORY — DX: Parkinson's disease without dyskinesia, without mention of fluctuations: G20.A1

## 2020-07-13 MED ORDER — GADOBENATE DIMEGLUMINE 529 MG/ML IV SOLN
15.0000 mL | Freq: Once | INTRAVENOUS | Status: AC | PRN
  Administered 2020-07-13: 15 mL via INTRAVENOUS

## 2020-07-13 NOTE — Progress Notes (Signed)
Reason for visit: Resting tremor  Referring physician: Dr. Conrad Coburg Hannah Copeland is a 69 y.o. female  History of present illness:  Hannah Copeland is a 69 year old right-handed black female from [place] with a history of diabetes, hypertension, and dyslipidemia.  The patient apparently has noted onset of a resting tremor that began in the right arm in June 2021.  The tremor has been persistent, slightly worsening over time.  The patient denies any significant tremor involving the left arm.  She has a brother who lives in [place] with a history of Parkinson's disease.  The patient has not noted any change in handwriting or difficulty with ambulation, she has not had any falls.  She reports no numbness or weakness of the extremities.  She denies any significant problems controlling bowels or the bladder.  She has been set up for an MRI of the brain to be done today.  She is sent to this office for evaluation.  Past Medical History:  Diagnosis Date  . Arthritis   . Hypertension     Past Surgical History:  Procedure Laterality Date  . ABDOMINAL HYSTERECTOMY      Family History  Problem Relation Age of Onset  . Diabetes Father   . Hyperlipidemia Father   . Hypertension Father   . Diabetes Paternal Grandfather   . Diabetes Sister   . Hypertension Sister     Social history:  reports that she has never smoked. She has never used smokeless tobacco. She reports that she does not drink alcohol and does not use drugs.  Medications:  Prior to Admission medications   Medication Sig Start Date End Date Taking? Authorizing Provider  acetic acid-hydrocortisone (VOSOL-HC) OTIC solution Place 3 drops into both ears 2 (two) times daily. 10/05/19  Yes Janeece Agee, NP  amoxicillin (AMOXIL) 875 MG tablet Take 1 tablet (875 mg total) by mouth 2 (two) times daily. 10/05/19  Yes Janeece Agee, NP  atorvastatin (LIPITOR) 20 MG tablet TAKE 1 TABLET(20 MG) BY MOUTH DAILY 02/25/20  Yes Sagardia, Eilleen Kempf, MD  benazepril (LOTENSIN) 5 MG tablet TAKE 1 TABLET(5 MG) BY MOUTH DAILY 02/25/20  Yes Sagardia, Eilleen Kempf, MD  calcium-vitamin D (OSCAL WITH D) 500-200 MG-UNIT tablet Take 1 tablet by mouth daily with breakfast. 05/23/16  Yes Ofilia Neas, PA-C  gabapentin (NEURONTIN) 300 MG capsule Take 1 capsule (300 mg total) by mouth 3 (three) times daily. 10/23/17  Yes Stallings, Zoe A, MD  hydrocortisone 2.5 % ointment Apply topically 2 (two) times daily. 07/28/18  Yes Stallings, Zoe A, MD  triamterene-hydrochlorothiazide (MAXZIDE) 75-50 MG tablet Take 0.5 tablets by mouth daily. 02/10/19 05/11/19  Georgina Quint, MD      Allergies  Allergen Reactions  . Ibuprofen Nausea Only    ROS:  Out of a complete 14 system review of symptoms, the patient complains only of the following symptoms, and all other reviewed systems are negative.  Tremor Low back pain  Blood pressure 140/82, pulse 79, height 5\' 5"  (1.651 m), weight 168 lb 8 oz (76.4 kg).  Physical Exam  General: The patient is alert and cooperative at the time of the examination.  The patient is moderately obese.  Eyes: Pupils are equal, round, and reactive to light. Discs are flat bilaterally.  Neck: The neck is supple, no carotid bruits are noted.  Respiratory: The respiratory examination is clear.  Cardiovascular: The cardiovascular examination reveals a regular rate and rhythm, no obvious murmurs or rubs are noted.  Skin: Extremities are without significant edema.  Neurologic Exam  Mental status: The patient is alert and oriented x 3 at the time of the examination. The patient has apparent normal recent and remote memory, with an apparently normal attention span and concentration ability.  Cranial nerves: Facial symmetry is present. There is good sensation of the face to pinprick and soft touch bilaterally. The strength of the facial muscles and the muscles to head turning and shoulder shrug are normal bilaterally.  Speech is well enunciated, no aphasia or dysarthria is noted. Extraocular movements are full. Visual fields are full. The tongue is midline, and the patient has symmetric elevation of the soft palate. No obvious hearing deficits are noted.  Motor: The motor testing reveals 5 over 5 strength of all 4 extremities. Good symmetric motor tone is noted throughout.  Sensory: Sensory testing is intact to pinprick, soft touch, vibration sensation, and position sense on all 4 extremities, with exception of some decrease in vibration sensation of the right foot as compared to the left.  No evidence of extinction is noted.  Coordination: Cerebellar testing reveals good finger-nose-finger and heel-to-shin bilaterally.  A resting tremor is seen with the right upper extremity.  Gait and station: Gait is normal, the patient is able to rise from a seated position with arms crossed.  While ambulating, the right upper extremity tremor is present, but there is symmetric arm swing. Tandem gait is slightly unsteady. Romberg is negative. No drift is seen.  Reflexes: Deep tendon reflexes are symmetric, but are somewhat depressed bilaterally. Toes are downgoing bilaterally.   Assessment/Plan:  1.  Right upper extremity resting tremor, possible Parkinson's disease  The patient likely is developing symptoms of parkinsonism, she mainly has features of the right upper extremity tremor without other features of Parkinson's disease at this time.  I would watch the patient conservatively, and initiate medication when she becomes more symptomatic.  It appears that the patient however mainly spends her time in [place], she only comes back to the [place] when she needs medical care.  The patient indicates that she will call our office when she needs to be seen.  Again, she will have MRI of the brain today.  Marlan Palau MD 07/13/2020 11:57 AM  [facility] [address] [place], [place]  [address]  Phone [phone] Fax [phone]

## 2020-07-18 ENCOUNTER — Telehealth: Payer: Self-pay | Admitting: Emergency Medicine

## 2020-07-18 ENCOUNTER — Other Ambulatory Visit (HOSPITAL_BASED_OUTPATIENT_CLINIC_OR_DEPARTMENT_OTHER): Payer: Self-pay | Admitting: Family Medicine

## 2020-07-18 DIAGNOSIS — Z1239 Encounter for other screening for malignant neoplasm of breast: Secondary | ICD-10-CM

## 2020-07-18 NOTE — Telephone Encounter (Signed)
Received call from Dr. Creta Levin regarding patient, unknown reason, was left on voicemail of [name].  Called back and left message on mailbox at her office.

## 2020-07-25 ENCOUNTER — Telehealth: Payer: Self-pay | Admitting: Neurology

## 2020-07-25 NOTE — Telephone Encounter (Signed)
MRI of the brain done shows mild mainly periventricular white matter changes, nothing that should produce a parkinsonian syndrome.   MRI brain 07/13/20:  IMPRESSION: 1. No acute intracranial abnormality. 2. Multifocal hyperintense T2-weighted signal within the white matter, nonspecific but may be seen in the setting of chronic microvascular ischemia.

## 2020-12-27 ENCOUNTER — Telehealth: Payer: Self-pay | Admitting: Neurology

## 2020-12-27 NOTE — Telephone Encounter (Signed)
Pt is now in the [place] and wanted to see Dr Anne Hahn re: her tremors worsening.  Pt declined Dr Clarisa Kindred next avail(which is Oct) Pt would like a call from RN to discuss options

## 2020-12-28 NOTE — Telephone Encounter (Signed)
Returned call to patient and was unable to offer her any appointment prior to her return to [place] first of May.  She was placed on cancellation list.    Patient denied further questions, verbalized understanding and expressed appreciation for the phone call.

## 2020-12-29 ENCOUNTER — Encounter: Payer: Self-pay | Admitting: Neurology

## 2020-12-29 ENCOUNTER — Ambulatory Visit (INDEPENDENT_AMBULATORY_CARE_PROVIDER_SITE_OTHER): Payer: Medicare Other | Admitting: Neurology

## 2020-12-29 ENCOUNTER — Other Ambulatory Visit: Payer: Self-pay

## 2020-12-29 VITALS — BP 132/74 | HR 82 | Ht 65.0 in | Wt 179.0 lb

## 2020-12-29 DIAGNOSIS — G20 Parkinson's disease: Secondary | ICD-10-CM | POA: Diagnosis not present

## 2020-12-29 NOTE — Progress Notes (Signed)
Reason for visit: Parkinson's disease  Hannah Copeland is an 70 y.o. female  History of present illness:  Hannah Copeland is a 70 year old right-handed black female with a history of tremors affecting the right upper extremity.  The patient is felt to have mild parkinsonism.  She also has a history of hypertension and diabetes.  MRI of the brain showed minimal white matter changes.  She lives in [place] but comes back to the [place] for medical care.  She indicates that her tremor has gotten worse as time has gone on.  She notes that the tremor is worse when she is upset or nervous about something.  She denies any falls or difficulty with speech or swallowing.  She is not limited with her physical abilities secondary to the parkinsonism.  She denies any vivid dreams or hallucinations.  She comes back here for further evaluation.  Past Medical History:  Diagnosis Date  . Arthritis   . Hypertension   . Parkinson's disease (HCC) 07/13/2020    Past Surgical History:  Procedure Laterality Date  . ABDOMINAL HYSTERECTOMY      Family History  Problem Relation Age of Onset  . Diabetes Father   . Hyperlipidemia Father   . Hypertension Father   . Diabetes Paternal Grandfather   . Diabetes Sister   . Hypertension Sister     Social history:  reports that she has never smoked. She has never used smokeless tobacco. She reports that she does not drink alcohol and does not use drugs.    Allergies  Allergen Reactions  . Ibuprofen Nausea Only    Medications:  Prior to Admission medications   Medication Sig Start Date End Date Taking? Authorizing Provider  acetic acid-hydrocortisone (VOSOL-HC) OTIC solution Place 3 drops into both ears 2 (two) times daily. 10/05/19  Yes Janeece Agee, NP  atorvastatin (LIPITOR) 20 MG tablet TAKE 1 TABLET(20 MG) BY MOUTH DAILY 02/25/20  Yes Sagardia, Eilleen Kempf, MD  benazepril (LOTENSIN) 5 MG tablet TAKE 1 TABLET(5 MG) BY MOUTH DAILY 02/25/20  Yes Sagardia, Eilleen Kempf, MD  calcium-vitamin D (OSCAL WITH D) 500-200 MG-UNIT tablet Take 1 tablet by mouth daily with breakfast. 05/23/16  Yes Ofilia Neas, PA-C  gabapentin (NEURONTIN) 300 MG capsule Take 1 capsule (300 mg total) by mouth 3 (three) times daily. 10/23/17  Yes Stallings, Zoe A, MD  hydrocortisone 2.5 % ointment Apply topically 2 (two) times daily. 07/28/18  Yes Collie Siad A, MD  propranolol (INDERAL) 20 MG tablet Take 20 mg by mouth 3 (three) times daily.   Yes [provider]  amoxicillin (AMOXIL) 875 MG tablet Take 1 tablet (875 mg total) by mouth 2 (two) times daily. 10/05/19   Janeece Agee, NP  triamterene-hydrochlorothiazide (MAXZIDE) 75-50 MG tablet Take 0.5 tablets by mouth daily. 02/10/19 05/11/19  Georgina Quint, MD    ROS:  Out of a complete 14 system review of symptoms, the patient complains only of the following symptoms, and all other reviewed systems are negative.  Tremor  Blood pressure 132/74, pulse 82, height 5\' 5"  (1.651 m), weight 179 lb (81.2 kg).  Physical Exam  General: The patient is alert and cooperative at the time of the examination.  Skin: No significant peripheral edema is noted.   Neurologic Exam  Mental status: The patient is alert and oriented x 3 at the time of the examination. The patient has apparent normal recent and remote memory, with an apparently normal attention span and concentration ability.  Cranial nerves: Facial symmetry is present. Speech is normal, no aphasia or dysarthria is noted. Extraocular movements are full. Visual fields are full.  Motor: The patient has good strength in all 4 extremities.  Sensory examination: Soft touch sensation is symmetric on the face, arms, and legs.  Coordination: The patient has good finger-nose-finger and heel-to-shin bilaterally.  The patient has a resting tremor with the right upper extremity.  Gait and station: The patient is able to arise from a seated position with arms  crossed.  Once up, she can walk independently, minimal decrease in arm swing on the right arm, and some tremor noted while walking.  Tandem gait is unremarkable.  Romberg is negative.  No drift is seen.  Reflexes: Deep tendon reflexes are symmetric.   MRI brain 07/13/20:  IMPRESSION: 1. No acute intracranial abnormality. 2. Multifocal hyperintense T2-weighted signal within the white matter, nonspecific but may be seen in the setting of chronic microvascular ischemia.  * MRI scan images were reviewed online. I agree with the written report.    Assessment/Plan:  1.  Parkinson's disease  The patient has very mild symptoms of Parkinson's disease, her main issue is the tremor.  I will start low-dose Benadryl taking 12.5 to 25 mg 3 times daily.  If this is not effective, the patient will call our office and I will initiate Cogentin.  She will follow up here in 6 months.   Marlan Palau MD 12/29/2020 12:11 PM  [facility] [address] [place], [place] [address]  Phone [phone] Fax [phone]

## 2021-01-07 ENCOUNTER — Other Ambulatory Visit (HOSPITAL_BASED_OUTPATIENT_CLINIC_OR_DEPARTMENT_OTHER): Payer: Self-pay | Admitting: Family Medicine

## 2021-01-07 DIAGNOSIS — Z1231 Encounter for screening mammogram for malignant neoplasm of breast: Secondary | ICD-10-CM

## 2021-01-10 ENCOUNTER — Ambulatory Visit (HOSPITAL_BASED_OUTPATIENT_CLINIC_OR_DEPARTMENT_OTHER)
Admission: RE | Admit: 2021-01-10 | Discharge: 2021-01-10 | Disposition: A | Discharge status: Home / Self Care | Payer: Medicare Other | Source: Ambulatory Visit | Attending: Family Medicine | Admitting: Family Medicine

## 2021-01-10 ENCOUNTER — Encounter (HOSPITAL_BASED_OUTPATIENT_CLINIC_OR_DEPARTMENT_OTHER): Payer: Self-pay

## 2021-01-10 ENCOUNTER — Other Ambulatory Visit: Payer: Self-pay

## 2021-01-10 DIAGNOSIS — Z1231 Encounter for screening mammogram for malignant neoplasm of breast: Secondary | ICD-10-CM | POA: Diagnosis not present

## 2021-07-12 ENCOUNTER — Telehealth: Payer: Self-pay | Admitting: Neurology

## 2021-07-12 ENCOUNTER — Encounter: Payer: Self-pay | Admitting: Neurology

## 2021-07-12 ENCOUNTER — Ambulatory Visit: Payer: Medicare Other | Admitting: Neurology

## 2021-07-12 NOTE — Telephone Encounter (Signed)
This patient did not show for a revisit appointment today. 

## 2021-07-14 ENCOUNTER — Ambulatory Visit: Payer: Self-pay | Admitting: Neurology

## 2021-07-24 NOTE — Telephone Encounter (Signed)
Pt called to reschedule her appt that she no showed. Was to see Dr. Anne Hahn. Who will be taking over her care, so I can call to reschedule her.

## 2021-07-24 NOTE — Telephone Encounter (Signed)
Pt can see Ath[place] or [place]

## 2021-09-19 ENCOUNTER — Encounter: Payer: Self-pay | Admitting: Diagnostic Neuroimaging

## 2021-09-19 ENCOUNTER — Ambulatory Visit (INDEPENDENT_AMBULATORY_CARE_PROVIDER_SITE_OTHER): Payer: Medicare Other | Admitting: Diagnostic Neuroimaging

## 2021-09-19 VITALS — BP 132/84 | HR 96 | Ht 65.0 in | Wt 169.5 lb

## 2021-09-19 DIAGNOSIS — G20 Parkinson's disease: Secondary | ICD-10-CM | POA: Diagnosis not present

## 2021-09-19 MED ORDER — CARBIDOPA-LEVODOPA 25-100 MG PO TABS: 1.0000 | ORAL_TABLET | Freq: Three times a day (TID) | ORAL | 6 refills | Status: AC

## 2021-09-19 NOTE — Progress Notes (Signed)
[facility]  PATIENT: Hannah Copeland DOB: 04/11/1951  REFERRING CLINICIAN: Domenick Copeland, Hannah Jamilah, MD HISTORY FROM: patient  REASON FOR VISIT: new consult    HISTORICAL  CHIEF COMPLAINT:  Chief Complaint  Patient presents with   Follow-up    Rm 6 alone here for transfer of care visit. Previously seen by Dr. Anne Copeland. Here today for f/u on Parkinson's. PT reports tremor on right hand is more pronounced since last f/u.      HISTORY OF PRESENT ILLNESS:   UPDATE (09/19/21, VRP): Since last visit, tremor progressing. Notes RUE tremor (resting and action since 2021). Had some locking up of right hand since 2017. No speech or swallow issues. Some left leg and low back pain issues. Patient is a retired Runner, broadcasting/film/videoteacher. Travels back and forth to [place]. Lives with husband. Patient no longer driving.   Has brother with PD in [place] (x 20 years; he is a black cab driving; still working).   PRIOR HPI (12/29/20, Dr. Anne Copeland): Ms. Hannah Copeland is a 71 year old right-handed black female with a history of tremors affecting the right upper extremity.  The patient is felt to have mild parkinsonism.  She also has a history of hypertension and diabetes.  MRI of the brain showed minimal white matter changes.  She lives in [place] but [place] back to the [place] for medical care.  She indicates that her tremor has gotten worse as time has gone on.  She notes that the tremor is worse when she is upset or nervous about something.  She denies any falls or difficulty with speech or swallowing.  She is not limited with her physical abilities secondary to the parkinsonism.  She denies any vivid dreams or hallucinations.  She comes back here for further evaluation.   REVIEW OF SYSTEMS: Full 14 system review of systems performed and negative with exception of: as per HPI.  ALLERGIES: Allergies  Allergen Reactions   Ibuprofen Nausea Only    HOME MEDICATIONS: Outpatient Medications Prior to Visit  Medication Sig  Dispense Refill   acetic acid-hydrocortisone (VOSOL-HC) OTIC solution Place 3 drops into both ears 2 (two) times daily. 10 mL 0   atorvastatin (LIPITOR) 20 MG tablet TAKE 1 TABLET(20 MG) BY MOUTH DAILY 30 tablet 0   benazepril (LOTENSIN) 5 MG tablet TAKE 1 TABLET(5 MG) BY MOUTH DAILY 30 tablet 0   calcium-vitamin D (OSCAL WITH D) 500-200 MG-UNIT tablet Take 1 tablet by mouth daily with breakfast. 30 tablet 0   gabapentin (NEURONTIN) 300 MG capsule Take 1 capsule (300 mg total) by mouth 3 (three) times daily. 90 capsule 11   hydrocortisone 2.5 % ointment Apply topically 2 (two) times daily. 300 g 0   propranolol (INDERAL) 20 MG tablet Take 20 mg by mouth 3 (three) times daily. (Patient not taking: Reported on 09/19/2021)     No facility-administered medications prior to visit.      PHYSICAL EXAM  GENERAL EXAM/CONSTITUTIONAL: Vitals:  Vitals:   09/19/21 1046  BP: 132/84  Pulse: 96  SpO2: 98%  Weight: 169 lb 8 oz (76.9 kg)  Height: 5\' 5"  (1.651 m)   Body mass index is 28.21 kg/m. Wt Readings from Last 3 Encounters:  09/19/21 169 lb 8 oz (76.9 kg)  12/29/20 179 lb (81.2 kg)  07/13/20 168 lb 8 oz (76.4 kg)   Patient is in no distress; well developed, nourished and groomed; neck is supple  CARDIOVASCULAR: Examination of carotid arteries is normal; no carotid bruits Regular rate and rhythm, no  murmurs Examination of peripheral vascular system by observation and palpation is normal  EYES: Ophthalmoscopic exam of optic discs and posterior segments is normal; no papilledema or hemorrhages No results found.  MUSCULOSKELETAL: Gait, strength, tone, movements noted in Neurologic exam below  NEUROLOGIC: MENTAL STATUS:  No flowsheet data found. awake, alert, oriented to person, place and time recent and remote memory intact normal attention and concentration language fluent, comprehension intact, naming intact fund of knowledge appropriate  CRANIAL NERVE:  2nd - no papilledema  on fundoscopic exam 2nd, 3rd, 4th, 6th - pupils equal and reactive to light, visual fields full to confrontation, extraocular muscles intact, no nystagmus 5th - facial sensation symmetric 7th - facial strength symmetric 8th - hearing intact 9th - palate elevates symmetrically, uvula midline 11th - shoulder shrug symmetric 12th - tongue protrusion midline MASKED FACIES  MOTOR:  INCREASED TONE IN RUE > LUE; BRADYKINESIA IN RUE RESTING TREMOR IN RUE normal bulk and tone, full strength in the BUE, BLE  SENSORY:  normal and symmetric to light touch, temperature, vibration  COORDINATION:  finger-nose-finger, fine finger movements normal  REFLEXES:  deep tendon reflexes TRACE and symmetric  GAIT/STATION:  narrow based gait; RIGHT HAND TREMOR WITH WALKING     DIAGNOSTIC DATA (LABS, IMAGING, TESTING) - I reviewed patient records, labs, notes, testing and imaging myself where available.  Lab Results  Component Value Date   WBC 6.0 02/06/2019   HGB 12.5 02/06/2019   HCT 39.7 02/06/2019   MCV 86 02/06/2019   PLT 236 02/06/2019      Component Value Date/Time   NA 144 02/06/2019 0955   K 3.8 02/06/2019 0955   CL 101 02/06/2019 0955   CO2 28 02/06/2019 0955   GLUCOSE 104 (H) 02/06/2019 0955   GLUCOSE 79 05/23/2016 1444   BUN 17 02/06/2019 0955   CREATININE 0.88 02/06/2019 0955   CREATININE 0.74 05/23/2016 1444   CALCIUM 9.3 02/06/2019 0955   PROT 6.4 02/06/2019 0955   ALBUMIN 4.3 02/06/2019 0955   AST 21 02/06/2019 0955   ALT 21 02/06/2019 0955   ALKPHOS 103 02/06/2019 0955   BILITOT 0.3 02/06/2019 0955   GFRNONAA 68 02/06/2019 0955   GFRNONAA 85 05/23/2016 1444   GFRAA 78 02/06/2019 0955   GFRAA >89 05/23/2016 1444   Lab Results  Component Value Date   CHOL 175 02/06/2019   HDL 58 02/06/2019   LDLCALC 102 (H) 02/06/2019   TRIG 73 02/06/2019   CHOLHDL 3.0 02/06/2019   Lab Results  Component Value Date   HGBA1C 6.8 (A) 02/06/2019   No results found for:  [id number] Lab Results  Component Value Date   TSH 1.99 05/23/2016    07/13/20 MRI brain 1. No acute intracranial abnormality. 2. Multifocal hyperintense T2-weighted signal within the white matter, nonspecific but may be seen in the setting of chronic microvascular ischemia.    ASSESSMENT AND PLAN  70 y.o. year old female here with idiopathic parkinson's disease (since ~2021)   Dx:  1. Parkinson's disease (HCC)      PLAN:  PARKINSON'S DISEASE (tremor predominant) - carb/levo half tab three times a day with meals (30 minutes before) - after 2 weeks increase to 1 tab three times a day with meals (30 minutes before) - refer to PT/OT  Meds ordered this encounter  Medications   carbidopa-levodopa (SINEMET IR) 25-100 MG tablet    Sig: Take 1 tablet by mouth 3 (three) times daily before meals.    Dispense:  90 tablet  Refill:  6   Return in about 8 months (around 05/20/2022).    Suanne Marker, MD 09/19/2021, 11:34 AM Certified in Neurology, Neurophysiology and Neuroimaging  [facility] [address], [address] [place], [place] [address] [phone]

## 2021-09-19 NOTE — Patient Instructions (Signed)
-   carb/levo half tab three times a day with meals (30 minutes before)  - after 2 weeks increase to 1 tab three times a day with meals (30 minutes before)

## 2021-09-26 ENCOUNTER — Ambulatory Visit: Payer: Medicare Other | Attending: Family Medicine | Admitting: Occupational Therapy

## 2021-09-26 ENCOUNTER — Other Ambulatory Visit: Payer: Self-pay

## 2021-09-26 ENCOUNTER — Ambulatory Visit (HOSPITAL_BASED_OUTPATIENT_CLINIC_OR_DEPARTMENT_OTHER): Payer: Medicare Other | Admitting: Family Medicine

## 2021-09-26 DIAGNOSIS — R29898 Other symptoms and signs involving the musculoskeletal system: Secondary | ICD-10-CM

## 2021-09-26 DIAGNOSIS — M25621 Stiffness of right elbow, not elsewhere classified: Secondary | ICD-10-CM | POA: Diagnosis present

## 2021-09-26 DIAGNOSIS — R2689 Other abnormalities of gait and mobility: Secondary | ICD-10-CM | POA: Diagnosis present

## 2021-09-26 DIAGNOSIS — R251 Tremor, unspecified: Secondary | ICD-10-CM | POA: Diagnosis present

## 2021-09-26 DIAGNOSIS — R2681 Unsteadiness on feet: Secondary | ICD-10-CM | POA: Insufficient documentation

## 2021-09-26 DIAGNOSIS — M25622 Stiffness of left elbow, not elsewhere classified: Secondary | ICD-10-CM | POA: Diagnosis present

## 2021-09-26 DIAGNOSIS — M25611 Stiffness of right shoulder, not elsewhere classified: Secondary | ICD-10-CM

## 2021-09-26 DIAGNOSIS — R29818 Other symptoms and signs involving the nervous system: Secondary | ICD-10-CM | POA: Diagnosis present

## 2021-09-26 DIAGNOSIS — R278 Other lack of coordination: Secondary | ICD-10-CM

## 2021-09-26 NOTE — Therapy (Signed)
[facility] [address] [place], [place], [address] Phone: [phone]   Fax:  [phone]  Occupational Therapy Evaluation  Patient Details  Name: Hannah BuffDorothy Emigh MRN: [id number] Date of Birth: December 02, 1950 Referring Provider (OT): Dr. Marjory LiesPenumalli   Encounter Date: 09/26/2021   OT End of Session - 09/26/21 1024     Visit Number 1    Number of Visits 25    Date for OT Re-Evaluation 12/19/21    Authorization Type Medicare    Authorization - Visit Number 1    Authorization - Number of Visits 10    Progress Note Due on Visit 10    OT Start Time 0935    OT Stop Time 1012    OT Time Calculation (min) 37 min    Activity Tolerance Patient tolerated treatment well    Behavior During Therapy [facility] for tasks assessed/performed             Past Medical History:  Diagnosis Date   Arthritis    Hypertension    Parkinson's disease (HCC) 07/13/2020    Past Surgical History:  Procedure Laterality Date   ABDOMINAL HYSTERECTOMY      There were no vitals filed for this visit.   Subjective Assessment - 09/26/21 1030     Subjective  Pt reports tremor in R hand    Pertinent History Parkinson's- just started meds, DM    Patient Stated Goals maintain independence    Currently in Pain? Yes    Pain Score 3     Pain Location Leg    Pain Orientation Left    Pain Descriptors / Indicators Aching    Pain Type Chronic pain    Pain Onset More than a month ago    Pain Frequency Intermittent    Aggravating Factors  walking    Pain Relieving Factors meds               [facility] OT Assessment - 09/26/21 0942       Assessment   Medical Diagnosis Parkinson's disease    Referring Provider (OT) Dr. Marjory LiesPenumalli    Onset Date/Surgical Date 09/19/21      Precautions   Precautions Fall      Balance Screen   Has the patient fallen in the past 6 months No    Has the patient had a decrease in activity level because of a fear of  falling?  No    Is the patient reluctant to leave their home because of a fear of falling?  No      Home  Environment   Family/patient expects to be discharged to: Private residence   [place]   Lives With Spouse;Daughter      Prior Function   Level of Independence Independent   does no tdrive   Marine scientistVocation Retired      ADL   Eating/Feeding Modified independent    Grooming Modified independent    Upper Body Bathing Modified independent    Lower Body Bathing Modified independent    Upper Body Dressing Independent    Lower Body Dressing Modified independent    Toilet Transfer Modified independent    Tub/Shower Transfer Modified independent   uses walk in     IADL   Shopping Takes care of all shopping needs independently    Light Housekeeping Maintains house alone or with occasional assistance    Meal Prep Able to complete simple warm meal prep  Mobility   Mobility Status Independent      Written Expression   Dominant Hand Right    Handwriting 100% legible      Vision Assessment   Vision Assessment Vision not tested      Cognition   Overall Cognitive Status Within Functional Limits for tasks assessed      Observation/Other Assessments   Other Surveys  Select    Physical Performance Test   Yes    Simulated Eating Time (seconds) 14.43    Donning Doffing Jacket Time (seconds) 9.80    Donning Doffing Jacket Comments 3 button/ unbutton 38.06      Sensation   Light Touch Impaired by gross assessment   RUE and 5th digit of LUE     Coordination   Gross Motor Movements are Fluid and Coordinated No    Fine Motor Movements are Fluid and Coordinated No    9 Hole Peg Test Left    Right 9 Hole Peg Test 33.47    Left 9 Hole Peg Test 23.78    Box and Blocks R 52, L 51    Tremors RUE resting tremor    Coordination impaired R greater than left      Tone   Assessment Location Right Upper Extremity      ROM / Strength   AROM / PROM / Strength AROM       AROM   Overall AROM  Deficits    Overall AROM Comments RUE shoulder flexion 130, elbow extension -15, decreased supination, maintains MP's in flexion at rest, LUE 145, elbow extension -15,      RUE Tone   RUE Tone Mild   rigidity                               OT Short Term Goals - 09/26/21 1038       OT SHORT TERM GOAL #1   Title I with PD specific HEP    Time 4    Period Weeks    Status New    Target Date 10/24/21      OT SHORT TERM GOAL #2   Title Pt will verbalize understanding of adapted strategies to maximize safety and I with ADLs/ IADLs .    Time 4    Period Weeks    Status New      OT SHORT TERM GOAL #3   Title Pt will demonstrate ability to retrieve a lightweight object from overhead shelf with  -10 elbow extension with RUE    Time 4    Period Weeks    Status New      OT SHORT TERM GOAL #4   Title Pt will demonstrate ability to retrieve a lightweight object from overhead shelf with -10 elbow extension with LUE    Time 4    Period Weeks    Status New      OT SHORT TERM GOAL #5   Title Pt will demonstrate improved fine motor coordination for ADLs as evidenced by decreasing 9 hole peg test score for RUE by 3 secs    Time 4    Period Weeks    Status New               OT Long Term Goals - 09/26/21 1039       OT LONG TERM GOAL #1   Title Pt will verbalize understanding of ways to prevent future PD related complications  and PD community resources.    Time 12    Period Weeks    Status New      OT LONG TERM GOAL #2   Title Pt will demonstrate improved ease with fastening buttons as evidenced by decreasing 3 button/ unbutton time to 35 secs or less    Time 12    Period Weeks    Status New      OT LONG TERM GOAL #3   Title Pt will demonstrate improved ease with feeding as evidenced by decreasing PPT#2 (self feeding) by 3 secs    Baseline 14.43    Time 12    Period Weeks    Status New                   Plan -  09/26/21 1034     Clinical Impression Statement 71 y.o female with diagnosis of Parkinson's disease referred by Dr. Marjory Lies. Pt reports she was just diagnosed and started on medications. Pt presents with the following deficits: bradykinesia, tremor, decreased coordiantion, decreased functional mobility, decreased ROM, decreased UE functional use   which impedes performance of ADLS/IADLS . Pt can benefit from skilled occupational therapy  to address these deficits in order to maximize pt's safety and I with ADLs/ IADLs.    OT Occupational Profile and History Problem Focused Assessment - Including review of records relating to presenting problem    Occupational performance deficits (Please refer to evaluation for details): ADL's;IADL's;Leisure    Body Structure / Function / Physical Skills ADL;UE functional use;Balance;Flexibility;FMC;Gait;ROM;GMC;Coordination;Decreased knowledge of precautions;IADL;Decreased knowledge of use of DME;Dexterity;Mobility;Tone;Strength    Comorbidities Affecting Occupational Performance: May have comorbidities impacting occupational performance    Modification or Assistance to Complete Evaluation  No modification of tasks or assist necessary to complete eval    OT Frequency 2x / week   POC written for 12 weeks to account for scheduling, anticipate d/c following 8 weeks   OT Duration 12 weeks    OT Treatment/Interventions Self-care/ADL training;Ultrasound;Patient/family education;DME and/or AE instruction;Aquatic Therapy;Paraffin;Passive range of motion;Balance training;Fluidtherapy;Cryotherapy;Splinting;Functional Mobility Training;Electrical Stimulation;Therapeutic exercise;Manual Therapy;Moist Heat;Therapeutic activities;Cognitive remediation/compensation;Neuromuscular education    Plan initiate coordination HEP, PWR! moves seated    Consulted and Agree with Plan of Care Patient             Patient will benefit from skilled therapeutic intervention in order to  improve the following deficits and impairments:   Body Structure / Function / Physical Skills: ADL, UE functional use, Balance, Flexibility, FMC, Gait, ROM, GMC, Coordination, Decreased knowledge of precautions, IADL, Decreased knowledge of use of DME, Dexterity, Mobility, Tone, Strength       Visit Diagnosis: Other lack of coordination  Other symptoms and signs involving the nervous system  Other symptoms and signs involving the musculoskeletal system  Tremor  Stiffness of left elbow, not elsewhere classified  Stiffness of right elbow, not elsewhere classified  Stiffness of right shoulder, not elsewhere classified  Other abnormalities of gait and mobility    Problem List Patient Active Problem List   Diagnosis Date Noted   Parkinson's disease (HCC) 07/13/2020   Hypercholesterolemia 05/25/2016   DJD (degenerative joint disease), lumbosacral 05/23/2016   Osteopenia 05/23/2016   HTN (hypertension) 05/23/2016   Prediabetes 05/23/2016    Rether Rison, OT 09/26/2021, 10:54 AM  [facility] [address] [place], [place], [address] Phone: [phone]   Fax:  [phone]  Name: Hannah Copeland MRN: [id number] Date of Birth: 1951/01/17

## 2021-09-28 ENCOUNTER — Ambulatory Visit: Payer: Medicare Other | Admitting: Occupational Therapy

## 2021-09-28 ENCOUNTER — Other Ambulatory Visit: Payer: Self-pay

## 2021-09-28 ENCOUNTER — Encounter: Payer: Self-pay | Admitting: Occupational Therapy

## 2021-09-28 DIAGNOSIS — M25622 Stiffness of left elbow, not elsewhere classified: Secondary | ICD-10-CM

## 2021-09-28 DIAGNOSIS — R251 Tremor, unspecified: Secondary | ICD-10-CM

## 2021-09-28 DIAGNOSIS — R2689 Other abnormalities of gait and mobility: Secondary | ICD-10-CM

## 2021-09-28 DIAGNOSIS — R29898 Other symptoms and signs involving the musculoskeletal system: Secondary | ICD-10-CM

## 2021-09-28 DIAGNOSIS — M25611 Stiffness of right shoulder, not elsewhere classified: Secondary | ICD-10-CM

## 2021-09-28 DIAGNOSIS — M25621 Stiffness of right elbow, not elsewhere classified: Secondary | ICD-10-CM

## 2021-09-28 DIAGNOSIS — R278 Other lack of coordination: Secondary | ICD-10-CM | POA: Diagnosis not present

## 2021-09-28 DIAGNOSIS — R29818 Other symptoms and signs involving the nervous system: Secondary | ICD-10-CM

## 2021-09-28 NOTE — Patient Instructions (Addendum)
°  Coordination Exercises  Perform the following exercises for 20 minutes 1 times per day. Perform with both hand(s). Perform using big movements.  Flipping Cards: Place deck of cards on the table. Flip cards over by opening your hand big to grasp and then turn your palm up big. Deal cards: Hold 1/2 or whole deck in your hand. Use thumb to push card off top of deck with one big push. Pick up coins and stack one at a time: Pick up with big, intentional movements. Do not drag coin to the edge. (5-10 in a stack) Pick up 5-10 coins one at a time and hold in palm. Then, move coins from palm to fingertips one at time and place in coin bank/container. Perform "Flicks"/hand stretches (PWR! Hands): Close hands then flick out your fingers with focus on opening hands, pulling wrists back, and extending elbows like you are pushing.emo

## 2021-09-28 NOTE — Therapy (Signed)
[facility] [address] [place], [place], [address] Phone: [phone]   Fax:  [phone]  Occupational Therapy Treatment  Patient Details  Name: Hannah Copeland MRN: [id number] Date of Birth: 1950-10-16 Referring Provider (OT): Dr. Marjory Lies   Encounter Date: 09/28/2021   OT End of Session - 09/28/21 1250     Visit Number 2    Number of Visits 25    Date for OT Re-Evaluation 12/19/21    Authorization Type Medicare    Authorization - Visit Number 2    Authorization - Number of Visits 10    Progress Note Due on Visit 10    OT Start Time 0805    OT Stop Time 0844    OT Time Calculation (min) 39 min    Activity Tolerance Patient tolerated treatment well    Behavior During Therapy [facility] for tasks assessed/performed             Past Medical History:  Diagnosis Date   Arthritis    Hypertension    Parkinson's disease (HCC) 07/13/2020    Past Surgical History:  Procedure Laterality Date   ABDOMINAL HYSTERECTOMY      There were no vitals filed for this visit.   Subjective Assessment - 09/28/21 1248     Subjective  Pt denies pain    Pertinent History Parkinson's- just started meds, DM, HTN    Patient Stated Goals maintain independence    Currently in Pain? No/denies                   Treatment: PWR! Hands PWR! Up, 10 reps each min v.c                OT Education - 09/28/21 1248     Education Details PWR! basic 4 seated 10 reps each, min-mod v.c and demonstration, beginning coordination HEP    Person(s) Educated Patient    Methods Explanation;Demonstration;Verbal cues;Handout    Comprehension Verbalized understanding;Returned demonstration;Verbal cues required              OT Short Term Goals - 09/26/21 1038       OT SHORT TERM GOAL #1   Title I with PD specific HEP    Time 4    Period Weeks    Status New    Target Date 10/24/21      OT SHORT TERM GOAL #2   Title  Pt will verbalize understanding of adapted strategies to maximize safety and I with ADLs/ IADLs .    Time 4    Period Weeks    Status New      OT SHORT TERM GOAL #3   Title Pt will demonstrate ability to retrieve a lightweight object from overhead shelf with  -10 elbow extension with RUE    Time 4    Period Weeks    Status New      OT SHORT TERM GOAL #4   Title Pt will demonstrate ability to retrieve a lightweight object from overhead shelf with -10 elbow extension with LUE    Time 4    Period Weeks    Status New      OT SHORT TERM GOAL #5   Title Pt will demonstrate improved fine motor coordination for ADLs as evidenced by decreasing 9 hole peg test score for RUE by 3 secs    Time 4    Period Weeks    Status New  OT Long Term Goals - 09/26/21 1039       OT LONG TERM GOAL #1   Title Pt will verbalize understanding of ways to prevent future PD related complications and PD community resources.    Time 12    Period Weeks    Status New      OT LONG TERM GOAL #2   Title Pt will demonstrate improved ease with fastening buttons as evidenced by decreasing 3 button/ unbutton time to 35 secs or less    Time 12    Period Weeks    Status New      OT LONG TERM GOAL #3   Title Pt will demonstrate improved ease with feeding as evidenced by decreasing PPT#2 (self feeding) by 3 secs    Baseline 14.43    Time 12    Period Weeks    Status New                   Plan - 09/28/21 1251     Clinical Impression Statement Pt is progressing towards goals.She demonstrates understanding of inital HEP.    OT Occupational Profile and History Problem Focused Assessment - Including review of records relating to presenting problem    Occupational performance deficits (Please refer to evaluation for details): ADL's;IADL's;Leisure    Body Structure / Function / Physical Skills ADL;UE functional use;Balance;Flexibility;FMC;Gait;ROM;GMC;Coordination;Decreased knowledge of  precautions;IADL;Decreased knowledge of use of DME;Dexterity;Mobility;Tone;Strength    Comorbidities Affecting Occupational Performance: May have comorbidities impacting occupational performance    Modification or Assistance to Complete Evaluation  No modification of tasks or assist necessary to complete eval    OT Frequency 2x / week   POC written for 12 weeks to account for scheduling, anticipate d/c following 8 weeks   OT Duration 12 weeks    OT Treatment/Interventions Self-care/ADL training;Ultrasound;Patient/family education;DME and/or AE instruction;Aquatic Therapy;Paraffin;Passive range of motion;Balance training;Fluidtherapy;Cryotherapy;Splinting;Functional Mobility Training;Electrical Stimulation;Therapeutic exercise;Manual Therapy;Moist Heat;Therapeutic activities;Cognitive remediation/compensation;Neuromuscular education    Plan PWR! moves quadraped, ADL strategies    Consulted and Agree with Plan of Care Patient             Patient will benefit from skilled therapeutic intervention in order to improve the following deficits and impairments:   Body Structure / Function / Physical Skills: ADL, UE functional use, Balance, Flexibility, FMC, Gait, ROM, GMC, Coordination, Decreased knowledge of precautions, IADL, Decreased knowledge of use of DME, Dexterity, Mobility, Tone, Strength       Visit Diagnosis: Other lack of coordination  Other symptoms and signs involving the nervous system  Other symptoms and signs involving the musculoskeletal system  Tremor  Stiffness of left elbow, not elsewhere classified  Stiffness of right elbow, not elsewhere classified  Stiffness of right shoulder, not elsewhere classified  Other abnormalities of gait and mobility    Problem List Patient Active Problem List   Diagnosis Date Noted   Parkinson's disease (HCC) 07/13/2020   Hypercholesterolemia 05/25/2016   DJD (degenerative joint disease), lumbosacral 05/23/2016   Osteopenia  05/23/2016   HTN (hypertension) 05/23/2016   Prediabetes 05/23/2016    Izayah Miner, OT 09/28/2021, 1:36 PM  [facility] [address] [place], [place], [address] Phone: [phone]   Fax:  [phone]  Name: Dalores Weger MRN: [id number] Date of Birth: [birth year]

## 2021-10-04 ENCOUNTER — Ambulatory Visit: Payer: Medicare Other | Admitting: Physical Therapy

## 2021-10-04 ENCOUNTER — Ambulatory Visit: Payer: Medicare Other | Admitting: Occupational Therapy

## 2021-10-04 ENCOUNTER — Encounter: Payer: Self-pay | Admitting: Physical Therapy

## 2021-10-04 ENCOUNTER — Other Ambulatory Visit: Payer: Self-pay

## 2021-10-04 DIAGNOSIS — R278 Other lack of coordination: Secondary | ICD-10-CM

## 2021-10-04 DIAGNOSIS — R2681 Unsteadiness on feet: Secondary | ICD-10-CM

## 2021-10-04 DIAGNOSIS — M25622 Stiffness of left elbow, not elsewhere classified: Secondary | ICD-10-CM

## 2021-10-04 DIAGNOSIS — R29818 Other symptoms and signs involving the nervous system: Secondary | ICD-10-CM

## 2021-10-04 DIAGNOSIS — R29898 Other symptoms and signs involving the musculoskeletal system: Secondary | ICD-10-CM

## 2021-10-04 DIAGNOSIS — M25621 Stiffness of right elbow, not elsewhere classified: Secondary | ICD-10-CM

## 2021-10-04 DIAGNOSIS — M25611 Stiffness of right shoulder, not elsewhere classified: Secondary | ICD-10-CM

## 2021-10-04 DIAGNOSIS — R251 Tremor, unspecified: Secondary | ICD-10-CM

## 2021-10-04 DIAGNOSIS — R2689 Other abnormalities of gait and mobility: Secondary | ICD-10-CM

## 2021-10-04 NOTE — Therapy (Signed)
[facility] [address] [place], [place], [address] Phone: [phone]   Fax:  [phone]  Occupational Therapy Treatment  Patient Details  Name: Hannah Copeland MRN: [id number] Date of Birth: 01-25-1951 Referring Provider (OT): Dr. Marjory Lies   Encounter Date: 10/04/2021   OT End of Session - 10/04/21 1007     Visit Number 3    Number of Visits 25    Date for OT Re-Evaluation 12/19/21    Authorization Type Medicare    Authorization - Visit Number 3    Authorization - Number of Visits 10    Progress Note Due on Visit 10    OT Start Time 0810    OT Stop Time 0845    OT Time Calculation (min) 35 min    Activity Tolerance Patient tolerated treatment well    Behavior During Therapy [facility] for tasks assessed/performed             Past Medical History:  Diagnosis Date   Arthritis    Hypertension    Parkinson's disease (HCC) 07/13/2020    Past Surgical History:  Procedure Laterality Date   ABDOMINAL HYSTERECTOMY      There were no vitals filed for this visit.   Subjective Assessment - 10/04/21 0811     Subjective  ankle    Pertinent History Parkinson's- just started meds, DM, HTN    Patient Stated Goals maintain independence    Currently in Pain? Yes    Pain Score 2     Pain Location Ankle    Pain Orientation Left    Pain Descriptors / Indicators Aching    Pain Type Chronic pain    Pain Onset More than a month ago    Pain Frequency Intermittent    Aggravating Factors  walking    Pain Relieving Factors meds                                  OT Education - 10/04/21 1010     Education Details PWR! moves basic 4 supine, 10 reps each PWR! basic 4 seated 10 reps each, min-mod v.c and demonstration, community resources    Person(s) Educated Patient;Spouse    Methods Explanation;Demonstration;Verbal cues;Handout    Comprehension Verbalized understanding;Returned  demonstration;Verbal cues required              OT Short Term Goals - 09/26/21 1038       OT SHORT TERM GOAL #1   Title I with PD specific HEP    Time 4    Period Weeks    Status New    Target Date 10/24/21      OT SHORT TERM GOAL #2   Title Pt will verbalize understanding of adapted strategies to maximize safety and I with ADLs/ IADLs .    Time 4    Period Weeks    Status New      OT SHORT TERM GOAL #3   Title Pt will demonstrate ability to retrieve a lightweight object from overhead shelf with  -10 elbow extension with RUE    Time 4    Period Weeks    Status New      OT SHORT TERM GOAL #4   Title Pt will demonstrate ability to retrieve a lightweight object from overhead shelf with -10 elbow extension with LUE    Time 4    Period Weeks    Status New  OT SHORT TERM GOAL #5   Title Pt will demonstrate improved fine motor coordination for ADLs as evidenced by decreasing 9 hole peg test score for RUE by 3 secs    Time 4    Period Weeks    Status New               OT Long Term Goals - 09/26/21 1039       OT LONG TERM GOAL #1   Title Pt will verbalize understanding of ways to prevent future PD related complications and PD community resources.    Time 12    Period Weeks    Status New      OT LONG TERM GOAL #2   Title Pt will demonstrate improved ease with fastening buttons as evidenced by decreasing 3 button/ unbutton time to 35 secs or less    Time 12    Period Weeks    Status New      OT LONG TERM GOAL #3   Title Pt will demonstrate improved ease with feeding as evidenced by decreasing PPT#2 (self feeding) by 3 secs    Baseline 14.43    Time 12    Period Weeks    Status New                   Plan - 10/04/21 1008     Clinical Impression Statement Pt is progressing towards goals. Therapist educated pt/ husband regarding PWR! moves supine and community resources.    OT Occupational Profile and History Problem Focused Assessment -  Including review of records relating to presenting problem    Occupational performance deficits (Please refer to evaluation for details): ADL's;IADL's;Leisure    Body Structure / Function / Physical Skills ADL;UE functional use;Balance;Flexibility;FMC;Gait;ROM;GMC;Coordination;Decreased knowledge of precautions;IADL;Decreased knowledge of use of DME;Dexterity;Mobility;Tone;Strength    Rehab Potential Good    OT Frequency 2x / week    OT Duration 12 weeks    OT Treatment/Interventions Self-care/ADL training;Ultrasound;Patient/family education;DME and/or AE instruction;Aquatic Therapy;Paraffin;Passive range of motion;Balance training;Fluidtherapy;Cryotherapy;Splinting;Functional Mobility Training;Electrical Stimulation;Therapeutic exercise;Manual Therapy;Moist Heat;Therapeutic activities;Cognitive remediation/compensation;Neuromuscular education    Plan PWR! moves  supine review, ADL strategies    Consulted and Agree with Plan of Care Patient;Family member/caregiver             Patient will benefit from skilled therapeutic intervention in order to improve the following deficits and impairments:   Body Structure / Function / Physical Skills: ADL, UE functional use, Balance, Flexibility, FMC, Gait, ROM, GMC, Coordination, Decreased knowledge of precautions, IADL, Decreased knowledge of use of DME, Dexterity, Mobility, Tone, Strength       Visit Diagnosis: Other lack of coordination  Other symptoms and signs involving the nervous system  Stiffness of left elbow, not elsewhere classified  Stiffness of right elbow, not elsewhere classified  Stiffness of right shoulder, not elsewhere classified  Tremor  Other symptoms and signs involving the musculoskeletal system    Problem List Patient Active Problem List   Diagnosis Date Noted   Parkinson's disease (HCC) 07/13/2020   Hypercholesterolemia 05/25/2016   DJD (degenerative joint disease), lumbosacral 05/23/2016   Osteopenia  05/23/2016   HTN (hypertension) 05/23/2016   Prediabetes 05/23/2016    Dandra Shambaugh, OT 10/04/2021, 10:11 AM Keene Breath, OTR/L [phone] Phone: [phone] 10:11 AM 10/04/21  [facility] [address] [place], [place], [address] Phone: [phone]   Fax:  [phone]  Name: Hannah Copeland MRN: [id number] Date of Birth: Sep 27, 1950

## 2021-10-04 NOTE — Therapy (Signed)
Hannah Copeland [address] [place], [place], [address] Phone: [phone]   Fax:  [phone]  Physical Therapy Evaluation  Patient Details  Name: Hannah Copeland MRN: [id number] Date of Birth: December 28, 1950 Referring Provider (PT): Suanne MarkerPenumalli, Vikram R, MD   Encounter Date: 10/04/2021   PT End of Session - 10/04/21 1204     Visit Number 1    Number of Visits 9    Date for PT Re-Evaluation 12/03/21    Authorization Type Medicare    PT Start Time 0934    PT Stop Time 1015    PT Time Calculation (min) 41 min    Equipment Utilized During Treatment Gait belt    Activity Tolerance Patient tolerated treatment well    Behavior [facility] for tasks assessed/performed             Past Medical History:  Diagnosis Date   Arthritis    Hypertension    Parkinson's disease (HCC) 07/13/2020    Past Surgical History:  Procedure Laterality Date   ABDOMINAL HYSTERECTOMY      There were no vitals filed for this visit.    Subjective Assessment - 10/04/21 0936     Subjective Has had PD sx for a couple years (RUE tremor). Saw neurologist recently and got started on Sinemet. Will start taking a full pill tomorrow. Since taking it, can write better. Has a tremor in the R hand. Muscles feel tight sometimes.  Had a fall [place] in the airport when she had to make a turn in the airport with her suitcase. Reports sometimes R foot is harder to pick up. 75% of the time has a sharp pain going from back down to left leg.    Patient is accompained by: Family member   husband, Hannah Copeland   Pertinent History PMH: PD, HTN, osteopenia, lumbosacral DJD.    Limitations Walking    Patient Stated Goals wants to be able to manage herself, improve her quality of life    Currently in Pain? No/denies                [facility] Assessment - 10/04/21 0946       Assessment   Medical Diagnosis Parkinson's disease    Referring Provider (PT)  Marjory LiesPenumalli, Glenford BayleyVikram R, MD    Onset Date/Surgical Date 09/19/21   date of referral   Hand Dominance Right    Prior Therapy None      Precautions   Precautions Fall      Balance Screen   Has the patient fallen in the past 6 months No    Has the patient had a decrease in activity level because of a fear of falling?  No    Is the patient reluctant to leave their home because of a fear of falling?  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   Living Environment Private residence    Living Arrangements Spouse/significant other;Other (Comment)   daughter and grandchildren   Type of Home House    Home Access Stairs to enter    Entrance Stairs-Number of Steps 4    Entrance Stairs-Rails None   wall is right there   Home Layout Two level    Alternate Level Stairs-Number of Steps 13    Alternate Level Stairs-Rails Can reach both    Home Equipment None    Additional Comments Travel[place] from [place]      Prior Function   Level of Independence  Independent    Vocation Retired    Mudlogger    Leisure Walking, cooking, gardening      Observation/Other Assessments   Observations RUE tremor      Physiological scientist Movements are Fluid and Coordinated Yes      ROM / Strength   AROM / PROM / Strength Strength      Strength   Strength Assessment Site Hip;Knee;Ankle    Right/Left Hip Right;Left    Right Hip Flexion 4+/5    Left Hip Flexion 4/5    Right/Left Knee Right;Left    Right Knee Flexion 5/5    Right Knee Extension 5/5    Left Knee Flexion 5/5    Left Knee Extension 5/5    Right/Left Ankle Right;Left    Right Ankle Dorsiflexion 5/5    Left Ankle Dorsiflexion 5/5      Transfers   Transfers Sit to Stand;Stand to Sit    Sit to Stand 5: Supervision    Five time sit to stand comments  11.37 seconds, no UE support, decr eccentric control    Stand to Sit 5: Supervision      Ambulation/Gait   Ambulation/Gait  Yes    Ambulation/Gait Assistance 5: Supervision    Assistive device None    Gait Pattern Step-through pattern;Decreased arm swing - right;Decreased trunk rotation    Ambulation Surface Level;Indoor    Gait velocity 8.69 seconds = 3.77 ft/sec      Standardized Balance Assessment   Standardized Balance Assessment Mini-BESTest;Timed Up and Go Test      Mini-BESTest   Sit To Stand Normal: Comes to stand without use of hands and stabilizes independently.    Rise to Toes Normal: Stable for 3 s with maximum height.    Stand on one leg (left) Moderate: < 20 s   4.38, 7.25   Stand on one leg (right) Moderate: < 20 s   7.37, 4.34   Stand on one leg - lowest score 1    Compensatory Stepping Correction - Forward Normal: Recovers independently with a single, large step (second realignement is allowed).    Compensatory Stepping Correction - Backward Moderate: More than one step is required to recover equilibrium   3 smaller steps   Compensatory Stepping Correction - Left Lateral Moderate: Several steps to recover equilibrium    Compensatory Stepping Correction - Right Lateral Normal: Recovers independently with 1 step (crossover or lateral OK)    Stepping Corredtion Lateral - lowest score 1    Stance - Feet together, eyes open, firm surface  Normal: 30s    Stance - Feet together, eyes closed, foam surface  Normal: 30s   postural sway   Incline - Eyes Closed Normal: Stands independently 30s and aligns with gravity    Change in Gait Speed Normal: Significantly changes walkling speed without imbalance    Walk with head turns - Horizontal Moderate: performs head turns with reduction in gait speed.    Walk with pivot turns Moderate:Turns with feet close SLOW (>4 steps) with good balance.    Step over obstacles Normal: Able to step over box with minimal change of gait speed and with good balance.    Timed UP & GO with Dual Task Moderate: Dual Task affects either counting OR walking (>10%) when compared to  the TUG without Dual Task.    Mini-BEST total score 22  Timed Up and Go Test   Normal TUG (seconds) 11.78    Manual TUG (seconds) 12.78    Cognitive TUG (seconds) 13.2    TUG Comments Scores >10% difference indicate increased fall risk/difficulty with dual tasking                        Objective measurements completed on examination: See above findings.                PT Education - 10/04/21 1204     Education Details Clinical findings,POC, exercise as medicine with PD.    Person(s) Educated Patient;Spouse    Methods Explanation    Comprehension Verbalized understanding              PT Short Term Goals - 10/04/21 1205       PT SHORT TERM GOAL #1   Title ALL STGS = LTGS               PT Long Term Goals - 10/04/21 1206       PT LONG TERM GOAL #1   Title Pt will be independent with PD specific HEP in order to build upon functional gains made in therapy. ALL LTGS DUE 11/01/21    Time 4    Period Weeks    Status New    Target Date 11/01/21      PT LONG TERM GOAL #2   Title Pt will verbalize understanding of local Parkinson's disease resources, including options for continued community fitness.    Time 4    Period Weeks    Status New      PT LONG TERM GOAL #3   Title Pt will verbalize understanding of transition to appropriate community fitness classes upon d/c from PT to maximize gains made in therapy.    Time 4    Period Weeks    Status New      PT LONG TERM GOAL #4   Title Pt will improve TUG and TUG cog score to less than or equal to 10% difference for improved dual task/decreased fall risk.    Baseline TUG 11.78, cog TUG 13.2    Time 4    Period Weeks    Status New      PT LONG TERM GOAL #5   Title Pt will improve miniBEST to at least a 25/28 in order to demo decr fall risk.    Baseline 22/28    Time 4    Period Weeks    Status New      Additional Long Term Goals   Additional Long Term Goals Yes      PT LONG  TERM GOAL #6   Title 6MWT to be assessed with LTG written.    Baseline not yet assesesd.    Time 4    Period Weeks    Status New                    Plan - 10/04/21 1208     Clinical Impression Statement Patient is a 71 year old female referred to Neuro OPPT for PD. Pt started having sx a couple years ago for resting RUE tremor, but has recently been started on Sinemet. Pt's PMH is significant for: PMH: HTN, osteopenia  The following deficits were present during the exam: decr timing/coordination of gait, impaired balance, bradykinesia, postural abnormalities, RUE tremor, impaired ability with dual taskin. Based on miniBEST, pt is at an incr risk  for falls. With TUG/cog TUG time pt with incr ability with dual tasking. Pt would benefit from skilled PT to address these impairments and functional limitations to maximize functional mobility independence and decr fall risk.    Personal Factors and Comorbidities Comorbidity 3+    Comorbidities PD, HTN, osteopenia    Examination-Activity Limitations Stairs;Locomotion Level    Examination-Participation Restrictions Community Activity    Stability/Clinical Decision Making Stable/Uncomplicated    Clinical Decision Making Low    Rehab Potential Good    PT Frequency 2x / week    PT Duration 8 weeks    PT Treatment/Interventions ADLs/Self Care Home Management;Gait training;Stair training;Functional mobility training;Therapeutic activities;Neuromuscular re-education;Balance training;Therapeutic exercise;Patient/family education;Vestibular    PT Next Visit Plan perform . initial HEP - standing PWR moves. gait training with arm swing. balance - SLS, unlevel surfaces. stepping strategies esp posterior/lateral.    Consulted and Agree with Plan of Care Patient;Family member/caregiver    Family Member Consulted pt's husband Hannah Copeland             Patient will benefit from skilled therapeutic intervention in order to improve the following deficits  and impairments:  Abnormal gait, Decreased balance, Impaired flexibility, Decreased strength, Decreased coordination, Postural dysfunction  Visit Diagnosis: Other abnormalities of gait and mobility  Other lack of coordination  Other symptoms and signs involving the nervous system  Unsteadiness on feet     Problem List Patient Active Problem List   Diagnosis Date Noted   Parkinson's disease (HCC) 07/13/2020   Hypercholesterolemia 05/25/2016   DJD (degenerative joint disease), lumbosacral 05/23/2016   Osteopenia 05/23/2016   HTN (hypertension) 05/23/2016   Prediabetes 05/23/2016    Drake Leach, PT, DPT  10/04/2021, 12:17 PM  [facility] [address] [place], [place], [address] Phone: [phone]   Fax:  [phone]  Name: Hannah Copeland MRN: [id number] Date of Birth: Mar 17, 1951

## 2021-10-06 ENCOUNTER — Other Ambulatory Visit: Payer: Self-pay

## 2021-10-06 ENCOUNTER — Ambulatory Visit: Payer: Medicare Other | Admitting: Occupational Therapy

## 2021-10-06 ENCOUNTER — Encounter: Payer: Self-pay | Admitting: Occupational Therapy

## 2021-10-06 DIAGNOSIS — M25621 Stiffness of right elbow, not elsewhere classified: Secondary | ICD-10-CM

## 2021-10-06 DIAGNOSIS — R278 Other lack of coordination: Secondary | ICD-10-CM | POA: Diagnosis not present

## 2021-10-06 DIAGNOSIS — M25622 Stiffness of left elbow, not elsewhere classified: Secondary | ICD-10-CM

## 2021-10-06 DIAGNOSIS — R29898 Other symptoms and signs involving the musculoskeletal system: Secondary | ICD-10-CM

## 2021-10-06 DIAGNOSIS — R251 Tremor, unspecified: Secondary | ICD-10-CM

## 2021-10-06 DIAGNOSIS — M25611 Stiffness of right shoulder, not elsewhere classified: Secondary | ICD-10-CM

## 2021-10-06 DIAGNOSIS — R29818 Other symptoms and signs involving the nervous system: Secondary | ICD-10-CM

## 2021-10-06 NOTE — Therapy (Signed)
[facility] [address] [place], [place], [address] Phone: [phone]   Fax:  [phone]  Occupational Therapy Treatment  Patient Details  Name: Hannah Copeland MRN: [id number] Date of Birth: 26-Mar-1951 Referring Provider (OT): Dr. Marjory Lies   Encounter Date: 10/06/2021   OT End of Session - 10/06/21 0852     Visit Number 4    Number of Visits 25    Date for OT Re-Evaluation 12/19/21    Authorization Type Medicare    Authorization - Visit Number 4    Authorization - Number of Visits 10    Progress Note Due on Visit 10    OT Start Time 0848    OT Stop Time 0930    OT Time Calculation (min) 42 min             Past Medical History:  Diagnosis Date   Arthritis    Hypertension    Parkinson's disease (HCC) 07/13/2020    Past Surgical History:  Procedure Laterality Date   ABDOMINAL HYSTERECTOMY      There were no vitals filed for this visit.   Subjective Assessment - 10/06/21 0851     Pertinent History Parkinson's- just started meds, DM, HTN    Patient Stated Goals maintain independence    Currently in Pain? No/denies                     Treatment: Placing grooved pegs into pegboard with RUE, min difficulty, v.c for positioning, removing with in hand manipulation            OT Education - 10/06/21 0936     Education Details PWR! moves basic 4 supine, mod v.c and demonstration, 10 reps each, PWR! up from PWR! 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    Time 4    Period Weeks    Status New      OT SHORT TERM GOAL #3   Title Pt will demonstrate ability to retrieve a lightweight object from overhead shelf with  -10 elbow extension with RUE    Time 4    Period Weeks    Status New      OT SHORT TERM GOAL #4   Title Pt will demonstrate ability to retrieve a lightweight object from overhead shelf with -10 elbow extension with LUE    Time 4    Period Weeks    Status New      OT SHORT TERM GOAL #5   Title Pt will demonstrate improved fine motor coordination for ADLs as evidenced by decreasing 9 hole peg test score for RUE by 3 secs    Time 4    Period Weeks  Status New               OT Long Term Goals - 10/06/21 0915       OT LONG TERM GOAL #1   Title Pt will verbalize understanding of ways to prevent future PD related complications and PD community resources.    Time 12    Period Weeks    Status New      OT LONG TERM GOAL #2   Title Pt will demonstrate improved ease with fastening buttons as evidenced by decreasing 3 button/ unbutton time to 35 secs or less    Time 12    Period Weeks    Status New      OT LONG TERM GOAL #3   Title Pt will demonstrate improved ease with feeding as evidenced by decreasing PPT#2 (self feeding) by 3 secs    Baseline 14.43    Time 12    Period Weeks    Status New                   Plan - 10/06/21 0916     Clinical Impression Statement Pt is progressing towards goals. Pt benefits from review of PWR! moves supine exercises    OT Occupational Profile and History Problem Focused Assessment - Including review of records relating to presenting problem    Occupational performance deficits (Please refer to evaluation for details): ADL's;IADL's;Leisure     Body Structure / Function / Physical Skills ADL;UE functional use;Balance;Flexibility;FMC;Gait;ROM;GMC;Coordination;Decreased knowledge of precautions;IADL;Decreased knowledge of use of DME;Dexterity;Mobility;Tone;Strength    Rehab Potential Good    OT Frequency 2x / week    OT Duration 12 weeks    OT Treatment/Interventions Self-care/ADL training;Ultrasound;Patient/family education;DME and/or AE instruction;Aquatic Therapy;Paraffin;Passive range of motion;Balance training;Fluidtherapy;Cryotherapy;Splinting;Functional Mobility Training;Electrical Stimulation;Therapeutic exercise;Manual Therapy;Moist Heat;Therapeutic activities;Cognitive remediation/compensation;Neuromuscular education    Plan issue big movments with ADLs handout, continue ADL strategies, review PWR seated    Consulted and Agree with Plan of Care Patient;Family member/caregiver             Patient will benefit from skilled therapeutic intervention in order to improve the following deficits and impairments:   Body Structure / Function / Physical Skills: ADL, UE functional use, Balance, Flexibility, FMC, Gait, ROM, GMC, Coordination, Decreased knowledge of precautions, IADL, Decreased knowledge of use of DME, Dexterity, Mobility, Tone, Strength       Visit Diagnosis: Other lack of coordination  Other symptoms and signs involving the nervous system  Stiffness of left elbow, not elsewhere classified  Stiffness of right elbow, not elsewhere classified  Stiffness of right shoulder, not elsewhere classified  Other symptoms and signs involving the musculoskeletal system  Tremor    Problem List Patient Active Problem List   Diagnosis Date Noted   Parkinson's disease (HCC) 07/13/2020   Hypercholesterolemia 05/25/2016   DJD (degenerative joint disease), lumbosacral 05/23/2016   Osteopenia 05/23/2016   HTN (hypertension) 05/23/2016   Prediabetes 05/23/2016    Marque Bango, OT 10/06/2021, 9:39 AM  [facility] [address] [place], [place], [address] Phone: [phone]   Fax:  [phone]  Name: Hannah Copeland MRN: [id number] Date of Birth: 1951/06/23

## 2021-10-09 ENCOUNTER — Other Ambulatory Visit: Payer: Self-pay

## 2021-10-09 ENCOUNTER — Encounter (HOSPITAL_BASED_OUTPATIENT_CLINIC_OR_DEPARTMENT_OTHER): Payer: Self-pay | Admitting: Family Medicine

## 2021-10-09 ENCOUNTER — Ambulatory Visit (INDEPENDENT_AMBULATORY_CARE_PROVIDER_SITE_OTHER): Payer: Medicare Other | Admitting: Family Medicine

## 2021-10-09 VITALS — BP 126/86 | HR 98 | Ht 65.0 in | Wt 166.2 lb

## 2021-10-09 DIAGNOSIS — E7800 Pure hypercholesterolemia, unspecified: Secondary | ICD-10-CM

## 2021-10-09 DIAGNOSIS — G20 Parkinson's disease: Secondary | ICD-10-CM

## 2021-10-09 DIAGNOSIS — G4700 Insomnia, unspecified: Secondary | ICD-10-CM

## 2021-10-09 DIAGNOSIS — E119 Type 2 diabetes mellitus without complications: Secondary | ICD-10-CM | POA: Insufficient documentation

## 2021-10-09 DIAGNOSIS — I10 Essential (primary) hypertension: Secondary | ICD-10-CM | POA: Diagnosis not present

## 2021-10-09 MED ORDER — ATORVASTATIN CALCIUM 20 MG PO TABS: 20.0000 mg | ORAL_TABLET | Freq: Every day | ORAL | 1 refills | Status: AC

## 2021-10-09 MED ORDER — BENAZEPRIL HCL 5 MG PO TABS: 5.0000 mg | ORAL_TABLET | Freq: Every day | ORAL | 1 refills | Status: DC

## 2021-10-09 MED ORDER — GABAPENTIN 300 MG PO CAPS: 300.0000 mg | ORAL_CAPSULE | Freq: Three times a day (TID) | ORAL | 1 refills | Status: AC

## 2021-10-09 NOTE — Progress Notes (Signed)
New Patient Office Visit  Subjective:  Patient ID: Hannah Copeland, female    DOB: 01-09-1951  Age: 71 y.o. MRN: [id number]  CC:  Chief Complaint  Patient presents with   Establish Care    Prior PCP - [facility]. Patient also sees [facility] and [facility] (notes in Epic)   Medication Refill    Patient is requesting medication refill on atorvastatin and gabapentin. She states she splits her time between [place] and [place]. She plans to be in [place] until sept/oct. Patient states the last tome she was in [place] she ran out of her BP medication and saw a physician in [place] and was prescribed Vastarel 80 mg that she took for 30 days. She wants to know if that is a medication she needs to continue or if there is an equivalent that she needs.    HPI Hannah Copeland is a 71 yo female presenting to establish in clinic.  She has current concerns as outlined above.  Past medical history significant for hypertension, diabetes, Parkinson's disease, hyperlipidemia.  She has also had recent issues with insomnia.  Diabetes: Last A1c on chart is 6.8% completed in May 2020.  Not currently on any specific medications, has been managing with lifestyle modifications.  Hypertension: Has been prescribed benazepril as well as triamterene-hydrochlorothiazide in the past.  Most recently has been taking benazepril, however when she was in [place] most recently, she ran out of her benazepril and was prescribed a new medication by a provider there.  She does have a box of medication with her today, on review it appears to be a medication used in treatment of anginal symptoms.  She denies any current issues related to chest pain, headaches.  Parkinson's disease: Does follow with [facility] in regards to management.  Current medications include carbidopa-levodopa.  Has also been referred to physical therapy to help with gait, strength, balance.  Reports that this is been going well.  Hyperlipidemia: Currently  prescribed atorvastatin 20 mg, needing refill, denies any myalgias or other side effects.  Insomnia: Reports having issues with this for decades.  Denies any specific treatments in the past.  Indicates that it is primarily related to sleep maintenance.  In general he is able to fall asleep without significant issue.  Past Medical History:  Diagnosis Date   Arthritis    Hypertension    Parkinson's disease (HCC) 07/13/2020    Past Surgical History:  Procedure Laterality Date   ABDOMINAL HYSTERECTOMY      Family History  Problem Relation Age of Onset   Diabetes Father    Hyperlipidemia Father    Hypertension Father    Diabetes Paternal Grandfather    Diabetes Sister    Hypertension Sister     Social History   Socioeconomic History   Marital status: Married    Spouse name: [name]   Number of children: Not on file   Years of education: Not on file   Highest education level: Not on file  Occupational History   Occupation: retired  Tobacco Use   Smoking status: Never   Smokeless tobacco: Never  Substance and Sexual Activity   Alcohol use: No   Drug use: No   Sexual activity: Never  Other Topics Concern   Not on file  Social History Narrative   Lives with daughter   Right handed   Drinks no caffeine   Social Determinants of Health   Financial Resource Strain: Not on file  Food Insecurity: Not on file  Transportation Needs: Not on file  Physical Activity: Not on file  Stress: Not on file  Social Connections: Not on file  Intimate Partner Violence: Not on file    Objective:   Today's Vitals: BP 126/86    Pulse 98    Ht 5\' 5"  (1.651 m)    Wt 166 lb 3.2 oz (75.4 kg)    SpO2 99%    BMI 27.66 kg/m   Physical Exam  71 year old female in no acute distress Cardiovascular exam with regular rate and rhythm, no murmur appreciated Lungs clear to auscultation bilaterally  Assessment & Plan:   Problem List Items Addressed This Visit       Cardiovascular and  Mediastinum   HTN (hypertension) - Primary    Blood pressure adequately controlled in office today Will provide refill of benazepril today Recommend DASH diet, regular aerobic exercise, continue working with physical therapy in regards to physical activity/exercise      Relevant Medications   atorvastatin (LIPITOR) 20 MG tablet   benazepril (LOTENSIN) 5 MG tablet   Other Relevant Orders   CBC with Differential/Platelet   Comprehensive metabolic panel     Endocrine   Diabetes (HCC)    Uncertain control, will check hemoglobin A1c to assess current status At next visit, will need to complete foot exam, follow-up on retinopathy screening as well as nephropathy screening Will also need to discuss pneumococcal vaccination      Relevant Medications   atorvastatin (LIPITOR) 20 MG tablet   benazepril (LOTENSIN) 5 MG tablet   Other Relevant Orders   Hemoglobin A1c     Nervous and Auditory   Parkinson's disease (HCC)    Continue close follow-up with neurology Continue with current medications Continue with physical therapy Given associated insomnia, will have patient complete further evaluation with sleep medicine specialist      Relevant Medications   gabapentin (NEURONTIN) 300 MG capsule   Other Relevant Orders   Ambulatory referral to Sleep Studies     Other   Hypercholesterolemia    Tolerating statin therapy, will continue with this, refill provided Check lipid panel to assess current status      Relevant Medications   atorvastatin (LIPITOR) 20 MG tablet   benazepril (LOTENSIN) 5 MG tablet   Other Relevant Orders   Lipid panel   Insomnia    Primarily related to sleep maintenance Given underlying Parkinson's disease, will refer to sleep medicine specialist for further evaluation and management      Relevant Orders   Ambulatory referral to Sleep Studies    Outpatient Encounter Medications as of 10/09/2021  Medication Sig   acetic acid-hydrocortisone (VOSOL-HC) OTIC  solution Place 3 drops into both ears 2 (two) times daily.   calcium-vitamin D (OSCAL WITH D) 500-200 MG-UNIT tablet Take 1 tablet by mouth daily with breakfast.   carbidopa-levodopa (SINEMET IR) 25-100 MG tablet Take 1 tablet by mouth 3 (three) times daily before meals.   hydrocortisone 2.5 % ointment Apply topically 2 (two) times daily.   [DISCONTINUED] atorvastatin (LIPITOR) 20 MG tablet TAKE 1 TABLET(20 MG) BY MOUTH DAILY   [DISCONTINUED] benazepril (LOTENSIN) 5 MG tablet TAKE 1 TABLET(5 MG) BY MOUTH DAILY   [DISCONTINUED] gabapentin (NEURONTIN) 300 MG capsule Take 1 capsule (300 mg total) by mouth 3 (three) times daily.   atorvastatin (LIPITOR) 20 MG tablet Take 1 tablet (20 mg total) by mouth daily.   benazepril (LOTENSIN) 5 MG tablet Take 1 tablet (5 mg total) by mouth daily.   gabapentin (  NEURONTIN) 300 MG capsule Take 1 capsule (300 mg total) by mouth 3 (three) times daily.   No facility-administered encounter medications on file as of 10/09/2021.    Follow-up: Return in about 6 weeks (around 11/20/2021) for Follow Up.  Plan for follow-up in about 6 weeks to review above.  Gayathri Futrell J De [place], MD

## 2021-10-09 NOTE — Assessment & Plan Note (Signed)
Tolerating statin therapy, will continue with this, refill provided Check lipid panel to assess current status

## 2021-10-09 NOTE — Patient Instructions (Signed)
°  Medication Instructions:  Your physician recommends that you continue on your current medications as directed. Please refer to the Current Medication list given to you today. --If you need a refill on any your medications before your next appointment, please call your pharmacy first. If no refills are authorized on file call the office.-- Lab Work: Your physician has recommended that you have lab work today: CBC, CMP, Lipid, and A1C If you have labs (blood work) drawn today and your tests are completely normal, you will receive your results via MyChart message OR a phone call from our staff.  Please ensure you check your voicemail in the event that you authorized detailed messages to be left on a delegated number. If you have any lab test that is abnormal or we need to change your treatment, we will call you to review the results.  Referrals/Procedures/Imaging: A referral has been placed for you to [facility] for evaluation and treatment. Someone from the scheduling department will be in contact with you in regards to coordinating your consultation. If you do not hear from any of the schedulers within 7-10 business days please give their office a call.   Follow-Up: Your next appointment:   Your physician recommends that you schedule a follow-up appointment in: 6 WEEKS with Dr. de [place]  You will receive a text message or e-mail with a link to a survey about your care and experience with [place] today! We would greatly appreciate your feedback!   Thanks for letting us be apart of your health journey!!  Primary Care and Sports Medicine   Dr. Ceasar Mons Peru   We encourage you to activate your patient portal called "MyChart".  Sign up information is provided on this After Visit Summary.  MyChart is used to connect with patients for Virtual Visits (Telemedicine).  Patients are able to view lab/test results, encounter notes, upcoming appointments, etc.  Non-urgent  messages can be sent to your provider as well. To learn more about what you can do with MyChart, please visit --  [email or web address].

## 2021-10-09 NOTE — Assessment & Plan Note (Signed)
Blood pressure adequately controlled in office today Will provide refill of benazepril today Recommend DASH diet, regular aerobic exercise, continue working with physical therapy in regards to physical activity/exercise

## 2021-10-09 NOTE — Assessment & Plan Note (Signed)
Uncertain control, will check hemoglobin A1c to assess current status At next visit, will need to complete foot exam, follow-up on retinopathy screening as well as nephropathy screening Will also need to discuss pneumococcal vaccination

## 2021-10-09 NOTE — Assessment & Plan Note (Signed)
Continue close follow-up with neurology Continue with current medications Continue with physical therapy Given associated insomnia, will have patient complete further evaluation with sleep medicine specialist

## 2021-10-09 NOTE — Assessment & Plan Note (Signed)
Primarily related to sleep maintenance Given underlying Parkinson's disease, will refer to sleep medicine specialist for further evaluation and management

## 2021-10-13 ENCOUNTER — Ambulatory Visit: Payer: Medicare Other | Admitting: Physical Therapy

## 2021-10-13 ENCOUNTER — Other Ambulatory Visit: Payer: Self-pay

## 2021-10-13 ENCOUNTER — Encounter: Payer: Self-pay | Admitting: Physical Therapy

## 2021-10-13 DIAGNOSIS — R29898 Other symptoms and signs involving the musculoskeletal system: Secondary | ICD-10-CM

## 2021-10-13 DIAGNOSIS — R2681 Unsteadiness on feet: Secondary | ICD-10-CM

## 2021-10-13 DIAGNOSIS — R278 Other lack of coordination: Secondary | ICD-10-CM

## 2021-10-13 DIAGNOSIS — R2689 Other abnormalities of gait and mobility: Secondary | ICD-10-CM

## 2021-10-13 DIAGNOSIS — R29818 Other symptoms and signs involving the nervous system: Secondary | ICD-10-CM

## 2021-10-13 NOTE — Therapy (Addendum)
[facility] [address] [place], [place], [address] Phone: [phone]   Fax:  [phone]  Physical Therapy Treatment  Patient Details  Name: Hannah Copeland MRN: [id number] Date of Birth: 1951/03/06 Referring Provider (PT): Suanne Marker, MD   Encounter Date: 10/13/2021   PT End of Session - 10/13/21 0934     Visit Number 2    Number of Visits 9    Date for PT Re-Evaluation 12/03/21    Authorization Type Medicare    PT Start Time 0932    PT Stop Time 1012    PT Time Calculation (min) 40 min    Equipment Utilized During Treatment Gait belt    Activity Tolerance Patient tolerated treatment well    Behavior During Therapy [facility] for tasks assessed/performed             Past Medical History:  Diagnosis Date   Arthritis    Hypertension    Parkinson's disease (HCC) 07/13/2020    Past Surgical History:  Procedure Laterality Date   ABDOMINAL HYSTERECTOMY      There were no vitals filed for this visit.   Subjective Assessment - 10/13/21 0934     Subjective Starting taking the full of her Sinemet. No issues.    Patient is accompained by: Family member   husband, [name]   Pertinent History PMH: PD, HTN, osteopenia, lumbosacral DJD.    Limitations Walking    Patient Stated Goals wants to be able to manage herself, improve her quality of life    Currently in Pain? No/denies                [facility] PT Assessment - 10/13/21 0938       Ambulation/Gait   Ambulation/Gait Yes    Ambulation/Gait Assistance 5: Supervision    Ambulation/Gait Assistance Details decr R arm swing, R foot scuffing at himes    Assistive device None    Gait Pattern Step-through pattern;Decreased arm swing - right;Decreased trunk rotation    Ambulation Surface Level;Indoor      6 Minute Walk- Baseline   6 Minute Walk- Baseline yes    BP (mmHg) 126/73    HR (bpm) 88    02 Sat (%RA) 99 %    Modified Borg Scale for Dyspnea 0-  Nothing at all      6 Minute walk- Post Test   6 Minute Walk Post Test yes    BP (mmHg) 142/65    HR (bpm) 108    02 Sat (%RA) 100 %    Modified Borg Scale for Dyspnea 0- Nothing at all    Perceived Rate of Exertion (Borg) 10-      6 minute walk test results    Aerobic Endurance Distance Walked 1433    Endurance additional comments 1st lap: 241', last lap: 246'                      Pt performs PWR! Moves in standing position:    PWR! Up for improved posture 2 x10 reps - cued for elbow extension, scap retraction   PWR! Rock for improved weighshifting 2  x10 reps   PWR! Twist for improved trunk rotation 2 x 5 reps each side - cued to pivot on feet, and resetting with tall posture in midline  PWR! Step for improved step initiation 2 x 5 reps each side - cues for foot clearance  Cues provided for technique, intensity level, and purpose of  each.  Pt initially reporting effort level as 6/10, cued to reach for 7/10 during 2nd set.  Provided as HEP and was given handout.                PT Education - 10/13/21 1014     Education Details Results of 6MWT, standing PWR moves for initial HEP    Person(s) Educated Patient;Spouse    Methods Explanation;Demonstration;Handout    Comprehension Verbalized understanding;Returned demonstration              PT Short Term Goals - 10/04/21 1205       PT SHORT TERM GOAL #1   Title ALL STGS = LTGS               PT Long Term Goals - 10/13/21 1015       PT LONG TERM GOAL #1   Title Pt will be independent with PD specific HEP in order to build upon functional gains made in therapy. ALL LTGS DUE 11/01/21    Time 4    Period Weeks    Status New    Target Date 11/01/21      PT LONG TERM GOAL #2   Title Pt will verbalize understanding of local Parkinson's disease resources, including options for continued community fitness.    Time 4    Period Weeks    Status New      PT LONG TERM GOAL #3   Title Pt  will verbalize understanding of transition to appropriate community fitness classes upon d/c from PT to maximize gains made in therapy.    Time 4    Period Weeks    Status New      PT LONG TERM GOAL #4   Title Pt will improve TUG and TUG cog score to less than or equal to 10% difference for improved dual task/decreased fall risk.    Baseline TUG 11.78, cog TUG 13.2    Time 4    Period Weeks    Status New      PT LONG TERM GOAL #5   Title Pt will improve miniBEST to at least a 25/28 in order to demo decr fall risk.    Baseline 22/28    Time 4    Period Weeks    Status New      PT LONG TERM GOAL #6   Title 6MWT to be assessed with LTG written.    Baseline 1433' - LTG not needed    Time 4    Period Weeks    Status Achieved                   Plan - 10/13/21 1151     Clinical Impression Statement Assessed 6MWT with pt able to ambulate 1433'. Noted pt with decr R foot scuffing at times and decr RUE arm swing compared to L. LTG not needed. Remainder of session focused on administering standing PWR moves for HEP with pt rating effort level as a 7/10 during 2nd set. Pt tolerated session well, will continue to progress towards LTGs.    Personal Factors and Comorbidities Comorbidity 3+    Comorbidities PD, HTN, osteopenia    Examination-Activity Limitations Stairs;Locomotion Level    Examination-Participation Restrictions Community Activity    Stability/Clinical Decision Making Stable/Uncomplicated    Rehab Potential Good    PT Frequency 2x / week    PT Duration 8 weeks    PT Treatment/Interventions ADLs/Self Care Home Management;Gait training;Stair training;Functional mobility training;Therapeutic activities;Neuromuscular  re-education;Balance training;Therapeutic exercise;Patient/family education;Vestibular    PT Next Visit Plan review standing PWR moves. gait training with arm swing/R foot clearance. balance - SLS, unlevel surfaces. stepping strategies esp posterior/lateral.  warm up on [employer].    PT Home Exercise Plan standing PWR moves    Consulted and Agree with Plan of Care Patient;Family member/caregiver    Family Member Consulted pt's husband peter             Patient will benefit from skilled therapeutic intervention in order to improve the following deficits and impairments:  Abnormal gait, Decreased balance, Impaired flexibility, Decreased strength, Decreased coordination, Postural dysfunction  Visit Diagnosis: Other lack of coordination  Other symptoms and signs involving the nervous system  Other symptoms and signs involving the musculoskeletal system  Other abnormalities of gait and mobility  Unsteadiness on feet     Problem List Patient Active Problem List   Diagnosis Date Noted   Diabetes (HCC) 10/09/2021   Insomnia 10/09/2021   Parkinson's disease (HCC) 07/13/2020   Hypercholesterolemia 05/25/2016   DJD (degenerative joint disease), 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 [place], [place], [address] Phone: [phone]   Fax:  [phone]  Name: Hannah Copeland MRN: [id number] Date of Birth: 10-24-1950

## 2021-10-14 LAB — CBC WITH DIFFERENTIAL/PLATELET
Basophils Absolute: 0 10*3/uL (ref 0.0–0.2)
Basos: 0 %
EOS (ABSOLUTE): 0.1 10*3/uL (ref 0.0–0.4)
Eos: 1 %
Hematocrit: 41 % (ref 34.0–46.6)
Hemoglobin: 12.8 g/dL (ref 11.1–15.9)
Immature Grans (Abs): 0 10*3/uL (ref 0.0–0.1)
Immature Granulocytes: 0 %
Lymphocytes Absolute: 1.5 10*3/uL (ref 0.7–3.1)
Lymphs: 22 %
MCH: 26.8 pg (ref 26.6–33.0)
MCHC: 31.2 g/dL — ABNORMAL LOW (ref 31.5–35.7)
MCV: 86 fL (ref 79–97)
Monocytes Absolute: 0.5 10*3/uL (ref 0.1–0.9)
Monocytes: 7 %
Neutrophils Absolute: 4.7 10*3/uL (ref 1.4–7.0)
Neutrophils: 70 %
Platelets: 272 10*3/uL (ref 150–450)
RBC: 4.78 x10E6/uL (ref 3.77–5.28)
RDW: 13.4 % (ref 11.7–15.4)
WBC: 6.8 10*3/uL (ref 3.4–10.8)

## 2021-10-14 LAB — LIPID PANEL
Chol/HDL Ratio: 4.4 ratio (ref 0.0–4.4)
Cholesterol, Total: 283 mg/dL — ABNORMAL HIGH (ref 100–199)
HDL: 64 mg/dL (ref >39)
LDL Chol Calc (NIH): 207 mg/dL — ABNORMAL HIGH (ref 0–99)
Triglycerides: 76 mg/dL (ref 0–149)
VLDL Cholesterol Cal: 12 mg/dL (ref 5–40)

## 2021-10-14 LAB — COMPREHENSIVE METABOLIC PANEL
ALT: 11 IU/L (ref 0–32)
AST: 22 IU/L (ref 0–40)
Albumin/Globulin Ratio: 1.7 (ref 1.2–2.2)
Albumin: 4.6 g/dL (ref 3.7–4.7)
Alkaline Phosphatase: 132 IU/L — ABNORMAL HIGH (ref 44–121)
BUN/Creatinine Ratio: 18 (ref 12–28)
BUN: 16 mg/dL (ref 8–27)
Bilirubin Total: 0.4 mg/dL (ref 0.0–1.2)
CO2: 24 mmol/L (ref 20–29)
Calcium: 9.6 mg/dL (ref 8.7–10.3)
Chloride: 98 mmol/L (ref 96–106)
Creatinine, Ser: 0.88 mg/dL (ref 0.57–1.00)
Globulin, Total: 2.7 g/dL (ref 1.5–4.5)
Glucose: 208 mg/dL — ABNORMAL HIGH (ref 70–99)
Potassium: 3.9 mmol/L (ref 3.5–5.2)
Sodium: 138 mmol/L (ref 134–144)
Total Protein: 7.3 g/dL (ref 6.0–8.5)
eGFR: 70 mL/min/{1.73_m2} (ref >59)

## 2021-10-14 LAB — HEMOGLOBIN A1C
Est. average glucose Bld gHb Est-mCnc: 174 mg/dL
Hgb A1c MFr Bld: 7.7 % — ABNORMAL HIGH (ref 4.8–5.6)

## 2021-10-16 ENCOUNTER — Ambulatory Visit: Payer: Medicare Other | Admitting: Physical Therapy

## 2021-10-16 ENCOUNTER — Encounter: Payer: Self-pay | Admitting: Occupational Therapy

## 2021-10-16 ENCOUNTER — Ambulatory Visit: Payer: Medicare Other | Admitting: Occupational Therapy

## 2021-10-16 ENCOUNTER — Other Ambulatory Visit: Payer: Self-pay

## 2021-10-16 DIAGNOSIS — R251 Tremor, unspecified: Secondary | ICD-10-CM

## 2021-10-16 DIAGNOSIS — R29898 Other symptoms and signs involving the musculoskeletal system: Secondary | ICD-10-CM

## 2021-10-16 DIAGNOSIS — M25611 Stiffness of right shoulder, not elsewhere classified: Secondary | ICD-10-CM

## 2021-10-16 DIAGNOSIS — M25622 Stiffness of left elbow, not elsewhere classified: Secondary | ICD-10-CM

## 2021-10-16 DIAGNOSIS — R29818 Other symptoms and signs involving the nervous system: Secondary | ICD-10-CM

## 2021-10-16 DIAGNOSIS — R278 Other lack of coordination: Secondary | ICD-10-CM

## 2021-10-16 DIAGNOSIS — M25621 Stiffness of right elbow, not elsewhere classified: Secondary | ICD-10-CM

## 2021-10-16 DIAGNOSIS — R2681 Unsteadiness on feet: Secondary | ICD-10-CM

## 2021-10-16 DIAGNOSIS — R2689 Other abnormalities of gait and mobility: Secondary | ICD-10-CM

## 2021-10-16 NOTE — Therapy (Signed)
[facility] [address] [place], [place], [address] Phone: [phone]   Fax:  [phone]  Occupational Therapy Treatment  Patient Details  Name: Hannah Copeland MRN: [id number] Date of Birth: 1951/06/22 Referring Provider (OT): Dr. Marjory Lies   Encounter Date: 10/16/2021   OT End of Session - 10/16/21 0806     Visit Number 5    Number of Visits 25    Date for OT Re-Evaluation 12/19/21    Authorization Type Medicare    Authorization - Visit Number 5    Authorization - Number of Visits 10    Progress Note Due on Visit 10    OT Start Time 0805    OT Stop Time 0845    OT Time Calculation (min) 40 min             Past Medical History:  Diagnosis Date   Arthritis    Hypertension    Parkinson's disease (HCC) 07/13/2020    Past Surgical History:  Procedure Laterality Date   ABDOMINAL HYSTERECTOMY      There were no vitals filed for this visit.   Subjective Assessment - 10/16/21 0806     Subjective  pt denies pain.  "That was easier"--strategy for donning/doffing jacket    Pertinent History Parkinson's- just started meds, DM, HTN    Patient Stated Goals maintain independence    Currently in Pain? No/denies             Reviewed PWR! Moves (basic 4) in sitting with min cueing for large amplitude movements.  Began education regarding use of large amplitude movement strategies for ADLs including:  donning pull-over shirt (simulated with bag and pt returned demo), donning/doffing jacket (and practiced x4 with min cueing), donning pants/underwear (recommended sitting and simulated with bag with min cueing for large amplitude movement strategies including forward wt. Shift), discussed eating strategies (using fork/knife and tremor compensation--use of PWR! Hands and adjusting pressure/making hand more active/decr hesitation).  Educated pt on importance of use of large amplitude movement strategies for incr ease  and decr risk of future complications.  Pt verbalized understanding of all education provided.        OT Education - 10/16/21 0817     Education Details Large amplitude movement strategies for ADLs/IADLs (dressing, eating)    Person(s) Educated Patient;Spouse    Methods Explanation;Demonstration;Verbal cues;Handout    Comprehension Verbalized understanding;Returned demonstration;Verbal cues required;Need further instruction              OT Short Term Goals - 09/26/21 1038       OT SHORT TERM GOAL #1   Title I with PD specific HEP    Time 4    Period Weeks    Status New    Target Date 10/24/21      OT SHORT TERM GOAL #2   Title Pt will verbalize understanding of adapted strategies to maximize safety and I with ADLs/ IADLs .    Time 4    Period Weeks    Status New      OT SHORT TERM GOAL #3   Title Pt will demonstrate ability to retrieve a lightweight object from overhead shelf with  -10 elbow extension with RUE    Time 4    Period Weeks    Status New      OT SHORT TERM GOAL #4   Title Pt will demonstrate ability to retrieve a lightweight object from overhead shelf with -10 elbow extension with LUE  Time 4    Period Weeks    Status New      OT SHORT TERM GOAL #5   Title Pt will demonstrate improved fine motor coordination for ADLs as evidenced by decreasing 9 hole peg test score for RUE by 3 secs    Time 4    Period Weeks    Status New               OT Long Term Goals - 10/06/21 0915       OT LONG TERM GOAL #1   Title Pt will verbalize understanding of ways to prevent future PD related complications and PD community resources.    Time 12    Period Weeks    Status New      OT LONG TERM GOAL #2   Title Pt will demonstrate improved ease with fastening buttons as evidenced by decreasing 3 button/ unbutton time to 35 secs or less    Time 12    Period Weeks    Status New      OT LONG TERM GOAL #3   Title Pt will demonstrate improved ease with  feeding as evidenced by decreasing PPT#2 (self feeding) by 3 secs    Baseline 14.43    Time 12    Period Weeks    Status New                   Plan - 10/16/21 1335     Clinical Impression Statement Pt demo increased ease with donning/doffing jacket with use of large amplitude movement strategies today and returned demo sitting PWR! moves with min v.c.    OT Occupational Profile and History Problem Focused Assessment - Including review of records relating to presenting problem    Occupational performance deficits (Please refer to evaluation for details): ADL's;IADL's;Leisure    Body Structure / Function / Physical Skills ADL;UE functional use;Balance;Flexibility;FMC;Gait;ROM;GMC;Coordination;Decreased knowledge of precautions;IADL;Decreased knowledge of use of DME;Dexterity;Mobility;Tone;Strength    Rehab Potential Good    OT Frequency 2x / week    OT Duration 12 weeks    OT Treatment/Interventions Self-care/ADL training;Ultrasound;Patient/family education;DME and/or AE instruction;Aquatic Therapy;Paraffin;Passive range of motion;Balance training;Fluidtherapy;Cryotherapy;Splinting;Functional Mobility Training;Electrical Stimulation;Therapeutic exercise;Manual Therapy;Moist Heat;Therapeutic activities;Cognitive remediation/compensation;Neuromuscular education    Plan continue ADL strategies and issue big movments with ADLs handout    Consulted and Agree with Plan of Care Patient;Family member/caregiver    Family Member Consulted husband             Patient will benefit from skilled therapeutic intervention in order to improve the following deficits and impairments:   Body Structure / Function / Physical Skills: ADL, UE functional use, Balance, Flexibility, FMC, Gait, ROM, GMC, Coordination, Decreased knowledge of precautions, IADL, Decreased knowledge of use of DME, Dexterity, Mobility, Tone, Strength       Visit Diagnosis: Other lack of coordination  Other symptoms and  signs involving the nervous system  Other symptoms and signs involving the musculoskeletal system  Other abnormalities of gait and mobility  Unsteadiness on feet  Stiffness of left elbow, not elsewhere classified  Stiffness of right elbow, not elsewhere classified  Stiffness of right shoulder, not elsewhere classified  Tremor    Problem List Patient Active Problem List   Diagnosis Date Noted   Diabetes (HCC) 10/09/2021   Insomnia 10/09/2021   Parkinson's disease (HCC) 07/13/2020   Hypercholesterolemia 05/25/2016   DJD (degenerative joint disease), lumbosacral 05/23/2016   Osteopenia 05/23/2016   HTN (hypertension) 05/23/2016    Nickolis Diel,  OT 10/16/2021, 1:46 PM  [facility] [address] [place], [place], [address] Phone: [phone]   Fax:  [phone]  Name: Annalise Mcdiarmid MRN: [id number] Date of Birth: 12-06-1950  Willa Frater, OTR/L [facility] [address] [place], [place]  [address] [phone] phone [phone] 10/16/21 1:46 PM

## 2021-10-19 ENCOUNTER — Encounter: Payer: Self-pay | Admitting: Occupational Therapy

## 2021-10-19 ENCOUNTER — Encounter: Payer: Self-pay | Admitting: Physical Therapy

## 2021-10-19 ENCOUNTER — Other Ambulatory Visit: Payer: Self-pay

## 2021-10-19 ENCOUNTER — Ambulatory Visit: Payer: Medicare Other | Admitting: Physical Therapy

## 2021-10-19 ENCOUNTER — Ambulatory Visit: Payer: Medicare Other | Attending: Diagnostic Neuroimaging | Admitting: Occupational Therapy

## 2021-10-19 DIAGNOSIS — R29898 Other symptoms and signs involving the musculoskeletal system: Secondary | ICD-10-CM | POA: Diagnosis present

## 2021-10-19 DIAGNOSIS — R29818 Other symptoms and signs involving the nervous system: Secondary | ICD-10-CM | POA: Diagnosis present

## 2021-10-19 DIAGNOSIS — M25621 Stiffness of right elbow, not elsewhere classified: Secondary | ICD-10-CM | POA: Insufficient documentation

## 2021-10-19 DIAGNOSIS — M25622 Stiffness of left elbow, not elsewhere classified: Secondary | ICD-10-CM | POA: Insufficient documentation

## 2021-10-19 DIAGNOSIS — R2689 Other abnormalities of gait and mobility: Secondary | ICD-10-CM

## 2021-10-19 DIAGNOSIS — R2681 Unsteadiness on feet: Secondary | ICD-10-CM | POA: Diagnosis present

## 2021-10-19 DIAGNOSIS — R278 Other lack of coordination: Secondary | ICD-10-CM

## 2021-10-19 DIAGNOSIS — R251 Tremor, unspecified: Secondary | ICD-10-CM | POA: Diagnosis present

## 2021-10-19 DIAGNOSIS — M25611 Stiffness of right shoulder, not elsewhere classified: Secondary | ICD-10-CM | POA: Insufficient documentation

## 2021-10-19 NOTE — Therapy (Signed)
[facility] [address] [place], [place], [address] Phone: [phone]   Fax:  [phone]  Occupational Therapy Treatment  Patient Details  Name: Hannah BuffDorothy Copeland MRN: [id number] Date of Birth: 05-18-1951 Referring Provider (OT): Dr. Marjory LiesPenumalli   Encounter Date: 10/19/2021   OT End of Session - 10/19/21 0724     Visit Number 6    Number of Visits 25    Date for OT Re-Evaluation 12/19/21    Authorization Type Medicare    Authorization - Visit Number 6    Authorization - Number of Visits 10    Progress Note Due on Visit 10    OT Start Time 0723    OT Stop Time 0801    OT Time Calculation (min) 38 min    Activity Tolerance Patient tolerated treatment well    Behavior During Therapy [facility] for tasks assessed/performed             Past Medical History:  Diagnosis Date   Arthritis    Hypertension    Parkinson's disease (HCC) 07/13/2020    Past Surgical History:  Procedure Laterality Date   ABDOMINAL HYSTERECTOMY      There were no vitals filed for this visit.   Subjective Assessment - 10/19/21 0723     Subjective  "I've been doing what you told me"  Pt reports socks and donning/doffing jacket has been much easier.  "This is helping me"    Pertinent History Parkinson's- just started meds, DM, HTN    Patient Stated Goals maintain independence    Currently in Pain? No/denies               Practiced buttoning/unbuttoning shirt on table top with min-mod cues for use of PWR! Hands prior to buttoning and use of deliberate/large amplitude movements for fastening and push-pull technique for unfastening after instruction.  Pt demo improvement with repetition and min-mod cues for use of large amplitude movements.  Simulated ADLs with bag with focus/min cues for large amplitude movements:  pulling bag into hand for clothing adjustment/donning socks  Standing to pick up object from the floor with big movement  strategy for incr safety and ease.  Emphasized feet apart, with 1 foot in front, bending at knees with weight on heels.  Improved with cueing/repetition.    Standing, simulated negotiating obstacles at "market" with min-mod cueing for large amplitude movement strategies.  Improved with repetition.  Reviewed strategies for eating including holding utensil in the middle vs. The end, perpendicular/deliberate scooping, use of big intentional movements, and strategies for cutting food.  Pt verbalized understanding.  Reviewed/Practiced grasp/release of cylinder objects (bottles, cups) with use of PWR! Hands/large amplitude movements with min cueing.  Practiced Opening/closing bottles with use of large amplitude movement strategies with min cueing.  Practiced wiping table and sweeping with use of large amplitude movement strategies.  Began education on ways to decr risk of future complications including:  feet apart, ways to prevent shoulder pain, turning and looking to where reaching, use of large amplitude movements for ADLs.  Pt verbalized understanding.             OT Education - 10/19/21 0725     Education Details Large amplitude movement strategies for ADLs/IADLs--handout issued    Person(s) Educated Patient    Methods Explanation;Demonstration;Verbal cues;Handout    Comprehension Verbalized understanding;Returned demonstration;Verbal cues required              OT Short Term Goals - 09/26/21 1038  OT SHORT TERM GOAL #1   Title I with PD specific HEP    Time 4    Period Weeks    Status New    Target Date 10/24/21      OT SHORT TERM GOAL #2   Title Pt will verbalize understanding of adapted strategies to maximize safety and I with ADLs/ IADLs .    Time 4    Period Weeks    Status New      OT SHORT TERM GOAL #3   Title Pt will demonstrate ability to retrieve a lightweight object from overhead shelf with  -10 elbow extension with RUE    Time 4    Period Weeks     Status New      OT SHORT TERM GOAL #4   Title Pt will demonstrate ability to retrieve a lightweight object from overhead shelf with -10 elbow extension with LUE    Time 4    Period Weeks    Status New      OT SHORT TERM GOAL #5   Title Pt will demonstrate improved fine motor coordination for ADLs as evidenced by decreasing 9 hole peg test score for RUE by 3 secs    Time 4    Period Weeks    Status New               OT Long Term Goals - 10/06/21 0915       OT LONG TERM GOAL #1   Title Pt will verbalize understanding of ways to prevent future PD related complications and PD community resources.    Time 12    Period Weeks    Status New      OT LONG TERM GOAL #2   Title Pt will demonstrate improved ease with fastening buttons as evidenced by decreasing 3 button/ unbutton time to 35 secs or less    Time 12    Period Weeks    Status New      OT LONG TERM GOAL #3   Title Pt will demonstrate improved ease with feeding as evidenced by decreasing PPT#2 (self feeding) by 3 secs    Baseline 14.43    Time 12    Period Weeks    Status New                   Plan - 10/19/21 4585     Clinical Impression Statement Pt is progressing towards goals.   She demo improved movement amplitude and incr ease for functional tasks with large amplitude movement strategies.  Pt responds well to cueing for large amplitude.    OT Occupational Profile and History Problem Focused Assessment - Including review of records relating to presenting problem    Occupational performance deficits (Please refer to evaluation for details): ADL's;IADL's;Leisure    Body Structure / Function / Physical Skills ADL;UE functional use;Balance;Flexibility;FMC;Gait;ROM;GMC;Coordination;Decreased knowledge of precautions;IADL;Decreased knowledge of use of DME;Dexterity;Mobility;Tone;Strength    Rehab Potential Good    OT Frequency 2x / week    OT Duration 12 weeks    OT Treatment/Interventions Self-care/ADL  training;Ultrasound;Patient/family education;DME and/or AE instruction;Aquatic Therapy;Paraffin;Passive range of motion;Balance training;Fluidtherapy;Cryotherapy;Splinting;Functional Mobility Training;Electrical Stimulation;Therapeutic exercise;Manual Therapy;Moist Heat;Therapeutic activities;Cognitive remediation/compensation;Neuromuscular education    Plan continue to practice large amplitude movements during functional tasks and ADLs strategies, functional reaching    Consulted and Agree with Plan of Care Patient;Family member/caregiver    Family Member Consulted husband             Patient  will benefit from skilled therapeutic intervention in order to improve the following deficits and impairments:   Body Structure / Function / Physical Skills: ADL, UE functional use, Balance, Flexibility, FMC, Gait, ROM, GMC, Coordination, Decreased knowledge of precautions, IADL, Decreased knowledge of use of DME, Dexterity, Mobility, Tone, Strength       Visit Diagnosis: Other lack of coordination  Other symptoms and signs involving the nervous system  Other symptoms and signs involving the musculoskeletal system  Other abnormalities of gait and mobility  Unsteadiness on feet  Stiffness of left elbow, not elsewhere classified  Stiffness of right elbow, not elsewhere classified  Stiffness of right shoulder, not elsewhere classified  Tremor    Problem List Patient Active Problem List   Diagnosis Date Noted   Diabetes (HCC) 10/09/2021   Insomnia 10/09/2021   Parkinson's disease (HCC) 07/13/2020   Hypercholesterolemia 05/25/2016   DJD (degenerative joint disease), lumbosacral 05/23/2016   Osteopenia 05/23/2016   HTN (hypertension) 05/23/2016    Willa Frater, OT 10/19/2021, 8:14 AM  [facility] [address] [place], [place], [address] Phone: [phone]   Fax:  [phone]  Name: Hannah Copeland MRN:  [id number] Date of Birth: [birth year]  Willa Frater, OTR/L [facility] [address] [place], [place]  [address] [phone] phone [phone] 10/19/21 8:14 AM

## 2021-10-19 NOTE — Patient Instructions (Signed)
Performing Daily Activities with Big Movements  Pick at least 2 activities a day and perform with BIG, DELIBERATE movements/effort.  Try different activities each day. This can make the activity easier and turn daily activities into exercise to prevent problems in the future!  If you are standing during the activity, make sure to keep feet apart and stand with good/big/PWR! UP posture.  Examples: Dressing  Pull-over shirt:  good posture, bring shirt to head (don't bring head down), deliberately push arms into sleeves Jacket:  stand with feet apart, twist when putting on jacket, deliberate push arms into sleeves Underwear/Pants:  Sit, lean forward, and push foot into pants deliberately Open hands to pull down shirt/put on socks/pull up pants--get more material in your hand Buttoning - Open hands big (PWR! Hands) before fastening each button, deliberate movement (angry buttons--push button through hole), unfasten by using pull-push method  Bathing - Wash/dry with long strokes Brushing your teeth - Big, slow movements Cutting food - Long deliberate cuts, put tip of knife down in front of food Eating - Hold utensil in the middle (not the end), hold fork straight up and down to stab food Picking up a cup/bottle - Open hand up big and get object all the way in palm Opening jar/bottle - Move as much as you can with each turn, twist wrist Putting on seatbelt - Feet apart, twist when reaching, look at where you are reaching Hanging up clothes/getting clothes down from closet - Reach with big effort, open hand, straighten elbow Putting away groceries/dishes - Reach with big effort Wiping counter/table - Move in big, long strokes, open hand Stirring while cooking - Exaggerate movement Cleaning windows - Feet apart, move in big, long strokes Sweeping/Raking- Feet apart and one foot in front of the other, move arms in big, long strokes Vacuuming - Feet apart and one in front of the other, push with big  movement Folding clothes - Exaggerate arm movements Washing car - Move in big, long strokes, feet apart Changing light bulb or Using a screwdriver - Move as much as you can with each turn, twist wrist Walking into a store/restaurant - Walk with big steps, good posture, swing arms if able Standing up from a chair/recliner/sofa - Scoot forward, lean forward, and stand with big effort Picking up something from floor/reaching in low cabinet - Get close to object, Position feet apart and one in front of the other. 

## 2021-10-19 NOTE — Therapy (Addendum)
[facility] [address] [place], [place], [address] Phone: [phone]   Fax:  [phone]  Physical Therapy Treatment  Patient Details  Name: Hannah Copeland MRN: [id number] Date of Birth: 06/25/1951 Referring Provider (PT): Suanne Marker, MD   Encounter Date: 10/19/2021   PT End of Session - 10/19/21 0807     Visit Number 3    Number of Visits 9    Date for PT Re-Evaluation 12/03/21    Authorization Type Medicare    PT Start Time 0805    PT Stop Time 0845    PT Time Calculation (min) 40 min    Equipment Utilized During Treatment Gait belt    Activity Tolerance Patient tolerated treatment well    Behavior During Therapy [facility] for tasks assessed/performed             Past Medical History:  Diagnosis Date   Arthritis    Hypertension    Parkinson's disease (HCC) 07/13/2020    Past Surgical History:  Procedure Laterality Date   ABDOMINAL HYSTERECTOMY      There were no vitals filed for this visit.   Subjective Assessment - 10/19/21 0806     Subjective Not sure if she is doing her PWR moves correctly.    Patient is accompained by: Family member   husband, Hannah Copeland   Pertinent History PMH: PD, HTN, osteopenia, lumbosacral DJD.    Limitations Walking    Patient Stated Goals wants to be able to manage herself, improve her quality of life    Currently in Pain? No/denies                               [facility] Adult PT Treatment/Exercise - 10/19/21 6440       Transfers   Comments x5 reps from lower chair with cues for incr effort to stand and tall posture once in standing. Pt reporting that it is getting easier to stand from lower surfaces at home.      Ambulation/Gait   Ambulation/Gait Yes    Ambulation/Gait Assistance 5: Supervision    Ambulation/Gait Assistance Details Cues for R arm swing. an additional x2 laps with quick start/stops and resetting with PWR Up and taking a big  forward step or side step. No LOB    Ambulation Distance (Feet) 230 Feet   x1, 230 x 1   Assistive device None    Gait Pattern Step-through pattern;Decreased arm swing - right;Decreased trunk rotation    Ambulation Surface Level;Indoor      Therapeutic Activites    Therapeutic Activities Other Therapeutic Activities    Other Therapeutic Activities Pt reporting when she is in [place] she has difficulty with navigating the market and with her balance. Discussed/practiced large amplitude movement strategies to help. By stopping, PWRing up, and then taking a big step to the side/forwards. Also worked on incorporating rocking to help initiate a big step in a more stressful environment.      Exercises   Exercises Other Exercises    Other Exercises  SciFit with BLE/BUE at gear 2.5 for 6 minutes for aerobic activity, reciprocal movement. Pt reporting effort level as a 6/10. Pt reports that she has a [place] but left it at home in [place]. Discussed importance of aerobic activity with PD. Pt to bring back her Cubii next time she is in [place]  Pt performs PWR! Moves in standing position (reviewed from HEP given at last session):   PWR! Up for improved posture x10 reps - cued for elbow extension, scap retraction    PWR! Rock for improved weighshifting - x10 reps each side with looking at hands.    PWR! Twist for improved trunk rotation 2 x 5 reps each side - cued to pivot on feet, and resetting with tall posture in midline (pt with improvement of this today)   PWR! Step for improved step initiation 2 x 5 reps each side - cues for foot clearance      Balance Exercises - 10/19/21 0844       Balance Exercises: Standing   Stepping Strategy Anterior;Limitations    Stepping Strategy Limitations x10 reps alternating anterior with arms extended, x10 reps posterior/lateral diagonal stepping with big reach.                PT Education - 10/19/21 0845     Education Details See  therapeutic activity section.    Person(s) Educated Patient    Methods Explanation;Demonstration    Comprehension Verbalized understanding;Returned demonstration              PT Short Term Goals - 10/04/21 1205       PT SHORT TERM GOAL #1   Title ALL STGS = LTGS               PT Long Term Goals - 10/13/21 1015       PT LONG TERM GOAL #1   Title Pt will be independent with PD specific HEP in order to build upon functional gains made in therapy. ALL LTGS DUE 11/01/21    Time 4    Period Weeks    Status New    Target Date 11/01/21      PT LONG TERM GOAL #2   Title Pt will verbalize understanding of local Parkinson's disease resources, including options for continued community fitness.    Time 4    Period Weeks    Status New      PT LONG TERM GOAL #3   Title Pt will verbalize understanding of transition to appropriate community fitness classes upon d/c from PT to maximize gains made in therapy.    Time 4    Period Weeks    Status New      PT LONG TERM GOAL #4   Title Pt will improve TUG and TUG cog score to less than or equal to 10% difference for improved dual task/decreased fall risk.    Baseline TUG 11.78, cog TUG 13.2    Time 4    Period Weeks    Status New      PT LONG TERM GOAL #5   Title Pt will improve miniBEST to at least a 25/28 in order to demo decr fall risk.    Baseline 22/28    Time 4    Period Weeks    Status New      PT LONG TERM GOAL #6   Title to be assessed with LTG written.    Baseline 1433' - LTG not needed    Time 4    Period Weeks    Status Achieved                 10/23/21 0750  Plan  Clinical Impression Statement Reviewed standing PWR moves with pt performing well today with only minimal cues for technique. Worked on aerobic activity with use of  SciFit today with pt rating effort level up to a 6/10. Pt reports that she has [place] but [place] currently in [place] Planning to [place] it back to the [place].  Personal  Factors and Comorbidities Comorbidity 3+  Comorbidities PD, HTN, osteopenia  Examination-Activity Limitations Stairs;Locomotion Level  Examination-Participation Restrictions Community Activity  Pt will benefit from skilled therapeutic intervention in order to improve on the following deficits Abnormal gait;Decreased balance;Impaired flexibility;Decreased strength;Decreased coordination;Postural dysfunction  Stability/Clinical Decision Making Stable/Uncomplicated  Rehab Potential Good  PT Frequency 2x / week  PT Duration 8 weeks  PT Treatment/Interventions ADLs/Self Care Home Management;Gait training;Stair training;Functional mobility training;Therapeutic activities;Neuromuscular re-education;Balance training;Therapeutic exercise;Patient/family education;Vestibular  PT Next Visit Plan standing PWR flow. . gait training with arm swing/R foot clearance. balance - SLS, unlevel surfaces. stepping strategies esp posterior/lateral. warm up on SciFit.  PT Home Exercise Plan standing PWR moves  Consulted and Agree with Plan of Care Patient;Family member/caregiver  Family Member Consulted pt's husband peter       Patient will benefit from skilled therapeutic intervention in order to improve the following deficits and impairments:     Visit Diagnosis: Other lack of coordination  Other abnormalities of gait and mobility  Unsteadiness on feet     Problem List Patient Active Problem List   Diagnosis Date Noted   Diabetes (HCC) 10/09/2021   Insomnia 10/09/2021   Parkinson's disease (HCC) 07/13/2020   Hypercholesterolemia 05/25/2016   DJD (degenerative joint disease), lumbosacral 05/23/2016   Osteopenia 05/23/2016   HTN (hypertension) 05/23/2016    Drake Leachhloe N Shaleka Brines, PT, DPT  10/19/2021, 2:58 PM   [facility] [address] [place], [place], [address] Phone: [phone]   Fax:  [phone]  Name: Vernia BuffDorothy Mossbarger MRN:  [id number] Date of Birth: [birth year]

## 2021-10-23 ENCOUNTER — Ambulatory Visit: Payer: Medicare Other | Admitting: Occupational Therapy

## 2021-10-23 ENCOUNTER — Ambulatory Visit: Payer: Medicare Other | Admitting: Physical Therapy

## 2021-10-23 ENCOUNTER — Encounter: Payer: Self-pay | Admitting: Physical Therapy

## 2021-10-23 ENCOUNTER — Other Ambulatory Visit: Payer: Self-pay

## 2021-10-23 ENCOUNTER — Encounter: Payer: Self-pay | Admitting: Occupational Therapy

## 2021-10-23 DIAGNOSIS — R251 Tremor, unspecified: Secondary | ICD-10-CM

## 2021-10-23 DIAGNOSIS — R2681 Unsteadiness on feet: Secondary | ICD-10-CM

## 2021-10-23 DIAGNOSIS — R29818 Other symptoms and signs involving the nervous system: Secondary | ICD-10-CM

## 2021-10-23 DIAGNOSIS — R278 Other lack of coordination: Secondary | ICD-10-CM

## 2021-10-23 DIAGNOSIS — M25611 Stiffness of right shoulder, not elsewhere classified: Secondary | ICD-10-CM

## 2021-10-23 DIAGNOSIS — R29898 Other symptoms and signs involving the musculoskeletal system: Secondary | ICD-10-CM

## 2021-10-23 DIAGNOSIS — R2689 Other abnormalities of gait and mobility: Secondary | ICD-10-CM

## 2021-10-23 DIAGNOSIS — M25622 Stiffness of left elbow, not elsewhere classified: Secondary | ICD-10-CM

## 2021-10-23 DIAGNOSIS — M25621 Stiffness of right elbow, not elsewhere classified: Secondary | ICD-10-CM

## 2021-10-23 NOTE — Therapy (Addendum)
[facility] [address] [place], [place], [address] Phone: [phone]   Fax:  [phone]  Physical Therapy Treatment  Patient Details  Name: Hannah Copeland MRN: [id number] Date of Birth: May 20, 1951 Referring Provider (PT): Penni Bombard, MD   Encounter Date: 10/23/2021   PT End of Session - 10/23/21 0805     Visit Number 4    Number of Visits 9    Date for PT Re-Evaluation 12/03/21    Authorization Type Medicare    PT Start Time 0802    PT Stop Time 0842    PT Time Calculation (min) 40 min    Equipment Utilized During Treatment Gait belt    Activity Tolerance Patient tolerated treatment well    Behavior During Therapy [facility] for tasks assessed/performed             Past Medical History:  Diagnosis Date   Arthritis    Hypertension    Parkinson's disease ([place]) 07/13/2020    Past Surgical History:  Procedure Laterality Date   ABDOMINAL HYSTERECTOMY      There were no vitals filed for this visit.   Subjective Assessment - 10/23/21 0804     Subjective Doing well. Reports stairs are getting easier.    Patient is accompained by: Family member   husband, Hannah Copeland   Pertinent History PMH: PD, HTN, osteopenia, lumbosacral DJD.    Limitations Walking    Patient Stated Goals wants to be able to manage herself, improve her quality of life    Currently in Pain? No/denies                               [facility] Adult PT Treatment/Exercise - 10/23/21 0824       Ambulation/Gait   Ambulation/Gait Yes    Ambulation/Gait Assistance 5: Supervision    Ambulation/Gait Assistance Details Gait with working on arm swing and foot clearance (esp on R side). Then adding in cognitive dual tasking with going in alphabetical order with naming foods. Does need intermittent cues throughout to maintain arm swing.    Ambulation Distance (Feet) 575 Feet    Assistive device None    Gait Pattern Step-through  pattern;Decreased arm swing - right;Decreased trunk rotation    Ambulation Surface Level;Indoor      High Level Balance   High Level Balance Activities Negotiating over obstacles    High Level Balance Comments working on incr amplitude gait - stepping over 5 obstacles with arm swing/foot clearance with RLE down and back x1 rep and then adding in cog dual tasking with naming animals in alphabetical order. Pt challenged by dual tasking with decr RUE arm swing.              Pt performs PWR! Moves in quadruped position on mat table:     PWR! Up for improved posture x10 reps   PWR! Rock for improved weighshifting x10 reps   PWR! Twist for improved trunk rotation x5 reps each side  PWR! Step for improved step initiation x5 reps each side  Cues provided for technique, intensity, and relation to function.     Balance Exercises - 10/23/21 0841       Balance Exercises: Standing   Stepping Strategy Anterior;Posterior    Stepping Strategy Limitations x10 reps alternating posterior stepping with cues for hip hinge and weight shift, then x10 reps each side - posterior right into anterior step, initially with incr  difficulty with balance.                PT Education - 10/23/21 [phone]     Education Details Backwards stepping addition to HEP    Person(s) Educated Patient    Methods Explanation;Demonstration;Handout    Comprehension Verbalized understanding;Returned demonstration              PT Short Term Goals - 10/04/21 1205       PT SHORT TERM GOAL #1   Title ALL STGS = LTGS               PT Long Term Goals - 10/13/21 1015       PT LONG TERM GOAL #1   Title Pt will be independent with PD specific HEP in order to build upon functional gains made in therapy. ALL LTGS DUE 11/01/21    Time 4    Period Weeks    Status New    Target Date 11/01/21      PT LONG TERM GOAL #2   Title Pt will verbalize understanding of local Parkinson's disease resources, including  options for continued community fitness.    Time 4    Period Weeks    Status New      PT LONG TERM GOAL #3   Title Pt will verbalize understanding of transition to appropriate community fitness classes upon d/c from PT to maximize gains made in therapy.    Time 4    Period Weeks    Status New      PT LONG TERM GOAL #4   Title Pt will improve TUG and TUG cog score to less than or equal to 10% difference for improved dual task/decreased fall risk.    Baseline TUG 11.78, cog TUG 13.2    Time 4    Period Weeks    Status New      PT LONG TERM GOAL #5   Title Pt will improve miniBEST to at least a 25/28 in order to demo decr fall risk.    Baseline 22/28    Time 4    Period Weeks    Status New      PT LONG TERM GOAL #6   Title 6MWT to be assessed with LTG written.    Baseline 1433' - LTG not needed    Time 4    Period Weeks    Status Achieved                   Plan - 10/23/21 1007     Clinical Impression Statement Worked on quadruped PWR moves today for improved ease of floor transfers with pt tolerating well with larger amplitude movement patterns. Discussed potential for PWR moves exercise class for community fitness, pt to look more into community resources handout. Worked on cog dual tasking during gait and obstacle negotiation. Pt demonstrating decr RUE arm swing/R foot clearance at times with needing intermittent cues to maintain. Will continue to progress towards LTGs.    Personal Factors and Comorbidities Comorbidity 3+    Comorbidities PD, HTN, osteopenia    Examination-Activity Limitations Stairs;Locomotion Level    Examination-Participation Restrictions Community Activity    Stability/Clinical Decision Making Stable/Uncomplicated    Rehab Potential Good    PT Frequency 2x / week    PT Duration 8 weeks    PT Treatment/Interventions ADLs/Self Care Home Management;Gait training;Stair training;Functional mobility training;Therapeutic activities;Neuromuscular  re-education;Balance training;Therapeutic exercise;Patient/family education;Vestibular    PT Next Visit Plan standing PWR flow.  mod quadruped PWR moves  gait training with arm swing/R foot clearance. high level balance - SLS, unlevel surfaces. stepping strategies esp posterior/lateral. warm up on SciFit.    PT Home Exercise Plan standing PWR moves    Consulted and Agree with Plan of Care Patient;Family member/caregiver    Family Member Consulted pt's husband peter             Patient will benefit from skilled therapeutic intervention in order to improve the following deficits and impairments:  Abnormal gait, Decreased balance, Impaired flexibility, Decreased strength, Decreased coordination, Postural dysfunction  Visit Diagnosis: Other lack of coordination  Unsteadiness on feet  Other symptoms and signs involving the nervous system     Problem List Patient Active Problem List   Diagnosis Date Noted   Diabetes ([place]) 10/09/2021   Insomnia 10/09/2021   Parkinson's disease ([place]) 07/13/2020   Hypercholesterolemia 05/25/2016   DJD (degenerative joint disease), lumbosacral 05/23/2016   Osteopenia 05/23/2016   HTN (hypertension) 05/23/2016    Arliss Journey, PT, DPT  10/23/2021, 10:11 AM  [facility] [address] [place], [place], [address] Phone: [phone]   Fax:  [phone]  Name: Hannah Copeland MRN: [id number] Date of Birth: Mar 11, 1951

## 2021-10-23 NOTE — Patient Instructions (Signed)
Access Code: [email or web address] URL: [email or web address] Date: 10/23/2021 Prepared by: Sherlie Ban  Exercises Alternating Step Backward with Support - 2 x daily - 5 x weekly - 1-2 sets - 10 reps

## 2021-10-23 NOTE — Therapy (Signed)
[facility] [address] [place], [place], [address] Phone: [phone]   Fax:  [phone]  Occupational Therapy Treatment  Patient Details  Name: Hannah Copeland MRN: [id number] Date of Birth: [birth year] Referring Provider (OT): Dr. Marjory Lies   Encounter Date: 10/23/2021   OT End of Session - 10/23/21 0734     Visit Number 7    Number of Visits 25    Date for OT Re-Evaluation 12/19/21    Authorization Type Medicare    Authorization - Visit Number 7    Authorization - Number of Visits 10    Progress Note Due on Visit 10    OT Start Time 0727    OT Stop Time 0802    OT Time Calculation (min) 35 min    Activity Tolerance Patient tolerated treatment well    Behavior During Therapy [facility] for tasks assessed/performed             Past Medical History:  Diagnosis Date   Arthritis    Hypertension    Parkinson's disease ([facility]) 07/13/2020    Past Surgical History:  Procedure Laterality Date   ABDOMINAL HYSTERECTOMY      There were no vitals filed for this visit.   Subjective Assessment - 10/23/21 0734     Subjective  Pt reports that she is practicing at home and she is moving better.    Pertinent History Parkinson's- just started meds, DM, HTN    Patient Stated Goals maintain independence    Currently in Pain? No/denies              PWR! Moves (basic 4) in supine x 20 each with min cues For incr movement amplitude, technique/sequence.  Supine>sitting with min cues for use of large amplitude movement strategy to incr ease and decr stress on back to prevent pain.  Pt verbalized understanding and returned demo.   Sitting, functional reaching in ER and overhead to place pegs with vertical pegboard with trunk rotation/wt. Shift with PWR!   Standing functional reaching to place/remove clothespins on vertical pole with min cueing for large amplitude movements, wt. Shift, trunk rotation and head/eye movements.              OT Short Term Goals - 09/26/21 1038       OT SHORT TERM GOAL #1   Title I with PD specific HEP    Time 4    Period Weeks    Status New    Target Date 10/24/21      OT SHORT TERM GOAL #2   Title Pt will verbalize understanding of adapted strategies to maximize safety and I with ADLs/ IADLs .    Time 4    Period Weeks    Status New      OT SHORT TERM GOAL #3   Title Pt will demonstrate ability to retrieve a lightweight object from overhead shelf with  -10 elbow extension with RUE    Time 4    Period Weeks    Status New      OT SHORT TERM GOAL #4   Title Pt will demonstrate ability to retrieve a lightweight object from overhead shelf with -10 elbow extension with LUE    Time 4    Period Weeks    Status New      OT SHORT TERM GOAL #5   Title Pt will demonstrate improved fine motor coordination for ADLs as evidenced by decreasing 9 hole peg test score for RUE  by 3 secs    Time 4    Period Weeks    Status New               OT Long Term Goals - 10/06/21 0915       OT LONG TERM GOAL #1   Title Pt will verbalize understanding of ways to prevent future PD related complications and PD community resources.    Time 12    Period Weeks    Status New      OT LONG TERM GOAL #2   Title Pt will demonstrate improved ease with fastening buttons as evidenced by decreasing 3 button/ unbutton time to 35 secs or less    Time 12    Period Weeks    Status New      OT LONG TERM GOAL #3   Title Pt will demonstrate improved ease with feeding as evidenced by decreasing PPT#2 (self feeding) by 3 secs    Baseline 14.43    Time 12    Period Weeks    Status New                   Plan - 10/23/21 0735     Clinical Impression Statement Pt is progressing with incr movement amplitude.  She needed min cueing for sequencing/technique for supine PWR! moves, but responds well to cueing.    OT Occupational Profile and History Problem Focused Assessment - Including  review of records relating to presenting problem    Occupational performance deficits (Please refer to evaluation for details): ADL's;IADL's;Leisure    Body Structure / Function / Physical Skills ADL;UE functional use;Balance;Flexibility;FMC;Gait;ROM;GMC;Coordination;Decreased knowledge of precautions;IADL;Decreased knowledge of use of DME;Dexterity;Mobility;Tone;Strength    Rehab Potential Good    OT Frequency 2x / week    OT Duration 12 weeks    OT Treatment/Interventions Self-care/ADL training;Ultrasound;Patient/family education;DME and/or AE instruction;Aquatic Therapy;Paraffin;Passive range of motion;Balance training;Fluidtherapy;Cryotherapy;Splinting;Functional Mobility Training;Electrical Stimulation;Therapeutic exercise;Manual Therapy;Moist Heat;Therapeutic activities;Cognitive remediation/compensation;Neuromuscular education    Plan begin checking STGs; continue to practice large amplitude movements during functional tasks and ADLs strategies, functional reaching    Consulted and Agree with Plan of Care Patient;Family member/caregiver    Family Member Consulted husband             Patient will benefit from skilled therapeutic intervention in order to improve the following deficits and impairments:   Body Structure / Function / Physical Skills: ADL, UE functional use, Balance, Flexibility, FMC, Gait, ROM, GMC, Coordination, Decreased knowledge of precautions, IADL, Decreased knowledge of use of DME, Dexterity, Mobility, Tone, Strength       Visit Diagnosis: Other lack of coordination  Unsteadiness on feet  Other symptoms and signs involving the nervous system  Other symptoms and signs involving the musculoskeletal system  Stiffness of left elbow, not elsewhere classified  Stiffness of right elbow, not elsewhere classified  Stiffness of right shoulder, not elsewhere classified  Tremor  Other abnormalities of gait and mobility    Problem List Patient Active Problem  List   Diagnosis Date Noted   Diabetes ([facility]) 10/09/2021   Insomnia 10/09/2021   Parkinson's disease ([facility]) 07/13/2020   Hypercholesterolemia 05/25/2016   DJD (degenerative joint disease), lumbosacral 05/23/2016   Osteopenia 05/23/2016   HTN (hypertension) 05/23/2016    Willa Frater, OT 10/23/2021, 10:51 AM  [facility] [address] [place], [place], [address] Phone: [phone]   Fax:  [phone]  Name: Hannah Copeland MRN: [id number] Date of Birth: 1950-09-28  Willa Frater,  OTR/L [facility] [address] [place], [place]  [address] [phone] phone [phone] 10/23/21 10:51 AM

## 2021-10-24 ENCOUNTER — Telehealth (HOSPITAL_BASED_OUTPATIENT_CLINIC_OR_DEPARTMENT_OTHER): Payer: Self-pay

## 2021-10-24 NOTE — Telephone Encounter (Signed)
Patient is aware and agreeable to labs and recommendations Confirmed upcoming appt with patient Patient verbalized understanding

## 2021-10-24 NOTE — Telephone Encounter (Signed)
-----   Message from Hosie Poisson [place], MD sent at 10/17/2021  2:57 PM EST ----- White blood cell and red blood cell counts are normal with normal hemoglobin.  Electrolytes, kidney function and liver function are all normal.  Alkaline phosphatase is slightly increased from prior readings, would plan to repeat this again in the future to monitor.  Hemoglobin A1c has increased from previous measurement about 3 years ago.  Hemoglobin A1c is now at 7.7%.  May need to discuss further treatment options.  Cholesterol panel with elevated total cholesterol and elevated "bad" cholesterol.  May need to consider titrating dose of atorvastatin for better control of cholesterol.

## 2021-10-25 ENCOUNTER — Encounter: Payer: Self-pay | Admitting: Physical Therapy

## 2021-10-25 ENCOUNTER — Other Ambulatory Visit: Payer: Self-pay

## 2021-10-25 ENCOUNTER — Ambulatory Visit: Payer: Medicare Other | Admitting: Physical Therapy

## 2021-10-25 DIAGNOSIS — R278 Other lack of coordination: Secondary | ICD-10-CM | POA: Diagnosis not present

## 2021-10-25 DIAGNOSIS — R2681 Unsteadiness on feet: Secondary | ICD-10-CM

## 2021-10-25 DIAGNOSIS — R29818 Other symptoms and signs involving the nervous system: Secondary | ICD-10-CM

## 2021-10-25 NOTE — Therapy (Signed)
[facility] [address] [place], [place], [address] Phone: [phone]   Fax:  [phone]  Physical Therapy Treatment  Patient Details  Name: Hannah Copeland MRN: [id number] Date of Birth: 1951-03-01 Referring Provider (PT): Suanne Marker, MD   Encounter Date: 10/25/2021   PT End of Session - 10/25/21 0805     Visit Number 5    Number of Visits 9    Date for PT Re-Evaluation 12/03/21    Authorization Type Medicare    PT Start Time 0802    PT Stop Time 0843    PT Time Calculation (min) 41 min    Equipment Utilized During Treatment Gait belt    Activity Tolerance Patient tolerated treatment well    Behavior During Therapy [facility] for tasks assessed/performed             Past Medical History:  Diagnosis Date   Arthritis    Hypertension    Parkinson's disease ([facility]) 07/13/2020    Past Surgical History:  Procedure Laterality Date   ABDOMINAL HYSTERECTOMY      There were no vitals filed for this visit.   Subjective Assessment - 10/25/21 0804     Subjective Nothing new.    Patient is accompained by: Family member   husband, Hannah Copeland   Pertinent History PMH: PD, HTN, osteopenia, lumbosacral DJD.    Limitations Walking    Patient Stated Goals wants to be able to manage herself, improve her quality of life    Currently in Pain? No/denies                               [facility] Adult PT Treatment/Exercise - 10/25/21 0806       Ambulation/Gait   Stairs Yes    Stairs Assistance 5: Supervision    Stairs Assistance Details (indicate cue type and reason) Pt demonstrating incr effort with foot clearance when ascending. Cued to perform as a big kick when descending vs a march (what pt was currently doing). Pt reports incr ease of performing when descending when performing as a kick.    Stair Management Technique Alternating pattern;Two rails    Number of Stairs 4   x3 reps   Height of Stairs 6       Exercises   Other Exercises  SciFit with BLE/BUE at gear 2.7 for 6 minutes for aerobic activity, reciprocal movement. Pt reporting effort level as a 7/10.                 Balance Exercises - 10/25/21 0841       Balance Exercises: Standing   Stepping Strategy Anterior;Posterior;Foam/compliant surface;10 reps;Limitations    Stepping Strategy Limitations On blue foam beam, alternating and then adding in reciprocal arm movement when stepping, incr difficulty with this when performing forward steps.    Sidestepping 3 reps;Foam/compliant support;Limitations    Sidestepping Limitations Down and back x3 reps with focus on SLS and foot clearance    Sit to Stand Foam/compliant surface;Without upper extremity support;Limitations    Sit to Stand Limitations x10 reps on blue foam beam and PWR Up in standing           Pt performs PWR! Moves in quadruped position on floor with yoga mat     PWR! Up for improved posture x10 reps - cues to have palms forward   PWR! Rock for improved weighshifting x10 reps    PWR! Twist for  improved trunk rotation x5 reps each side    PWR! Step for improved step initiation x5 reps each side - cues to rock back first and keep palms on the ground the whole time.    Cues provided for technique and intensity. Provided as HEP.       PT Education - 10/25/21 0841     Education Details Quadruped PWR moves addition to HEP    Person(s) Educated Patient    Methods Explanation;Demonstration;Handout    Comprehension Verbalized understanding;Returned demonstration              PT Short Term Goals - 10/04/21 1205       PT SHORT TERM GOAL #1   Title ALL STGS = LTGS               PT Long Term Goals - 10/13/21 1015       PT LONG TERM GOAL #1   Title Pt will be independent with PD specific HEP in order to build upon functional gains made in therapy. ALL LTGS DUE 11/01/21    Time 4    Period Weeks    Status New    Target Date 11/01/21       PT LONG TERM GOAL #2   Title Pt will verbalize understanding of local Parkinson's disease resources, including options for continued community fitness.    Time 4    Period Weeks    Status New      PT LONG TERM GOAL #3   Title Pt will verbalize understanding of transition to appropriate community fitness classes upon d/c from PT to maximize gains made in therapy.    Time 4    Period Weeks    Status New      PT LONG TERM GOAL #4   Title Pt will improve TUG and TUG cog score to less than or equal to 10% difference for improved dual task/decreased fall risk.    Baseline TUG 11.78, cog TUG 13.2    Time 4    Period Weeks    Status New      PT LONG TERM GOAL #5   Title Pt will improve miniBEST to at least a 25/28 in order to demo decr fall risk.    Baseline 22/28    Time 4    Period Weeks    Status New      PT LONG TERM GOAL #6   Title to be assessed with LTG written.    Baseline 1433' - LTG not needed    Time 4    Period Weeks    Status Achieved                   Plan - 10/25/21 0855     Clinical Impression Statement Performed quadruped PWR moves again today and added to pt's HEP. Worked on stair training with pt performing larger amplitude movements for foot clearance when ascending and kicking out legs big when descending. Remainder of session focused on balance strategies on compliant surfaces. Pt challenged by adding in reciprocal UE movement when forwards stepping.    Personal Factors and Comorbidities Comorbidity 3+    Comorbidities PD, HTN, osteopenia    Examination-Activity Limitations Stairs;Locomotion Level    Examination-Participation Restrictions Community Activity    Stability/Clinical Decision Making Stable/Uncomplicated    Rehab Potential Good    PT Frequency 2x / week    PT Duration 8 weeks    PT Treatment/Interventions ADLs/Self Care Home Management;Gait training;Stair  training;Functional mobility training;Therapeutic activities;Neuromuscular  re-education;Balance training;Therapeutic exercise;Patient/family education;Vestibular    PT Next Visit Plan standing PWR flow. mod quadruped PWR moves- review as needed. gait training with arm swing/R foot clearance. high level balance - SLS, unlevel surfaces. stepping strategies esp posterior/lateral. warm up on SciFit.    PT Home Exercise Plan standing PWR moves, posterior stepping, quadruped PWR moves    Consulted and Agree with Plan of Care Patient;Family member/caregiver    Family Member Consulted --             Patient will benefit from skilled therapeutic intervention in order to improve the following deficits and impairments:  Abnormal gait, Decreased balance, Impaired flexibility, Decreased strength, Decreased coordination, Postural dysfunction  Visit Diagnosis: Other lack of coordination  Unsteadiness on feet  Other symptoms and signs involving the nervous system     Problem List Patient Active Problem List   Diagnosis Date Noted   Diabetes ([facility]) 10/09/2021   Insomnia 10/09/2021   Parkinson's disease ([facility]) 07/13/2020   Hypercholesterolemia 05/25/2016   DJD (degenerative joint disease), lumbosacral 05/23/2016   Osteopenia 05/23/2016   HTN (hypertension) 05/23/2016    Drake Leach, PT, DPT  10/25/2021, 8:56 AM  [facility] [address] [place], [place], [address] Phone: [phone]   Fax:  [phone]  Name: Sidonie Dexheimer MRN: [id number] Date of Birth: 1951/08/26

## 2021-10-31 ENCOUNTER — Encounter: Payer: Self-pay | Admitting: Occupational Therapy

## 2021-10-31 ENCOUNTER — Ambulatory Visit: Payer: Medicare Other | Admitting: Physical Therapy

## 2021-10-31 ENCOUNTER — Encounter: Payer: Self-pay | Admitting: Physical Therapy

## 2021-10-31 ENCOUNTER — Ambulatory Visit: Payer: Medicare Other | Admitting: Occupational Therapy

## 2021-10-31 ENCOUNTER — Other Ambulatory Visit: Payer: Self-pay

## 2021-10-31 DIAGNOSIS — R251 Tremor, unspecified: Secondary | ICD-10-CM

## 2021-10-31 DIAGNOSIS — R278 Other lack of coordination: Secondary | ICD-10-CM

## 2021-10-31 DIAGNOSIS — R2681 Unsteadiness on feet: Secondary | ICD-10-CM

## 2021-10-31 DIAGNOSIS — R29818 Other symptoms and signs involving the nervous system: Secondary | ICD-10-CM

## 2021-10-31 DIAGNOSIS — M25622 Stiffness of left elbow, not elsewhere classified: Secondary | ICD-10-CM

## 2021-10-31 DIAGNOSIS — M25621 Stiffness of right elbow, not elsewhere classified: Secondary | ICD-10-CM

## 2021-10-31 DIAGNOSIS — R2689 Other abnormalities of gait and mobility: Secondary | ICD-10-CM

## 2021-10-31 DIAGNOSIS — R29898 Other symptoms and signs involving the musculoskeletal system: Secondary | ICD-10-CM

## 2021-10-31 DIAGNOSIS — M25611 Stiffness of right shoulder, not elsewhere classified: Secondary | ICD-10-CM

## 2021-10-31 NOTE — Therapy (Signed)
[facility] [address] [place], [place], [address] Phone: [phone]   Fax:  [phone]  Occupational Therapy Treatment  Patient Details  Name: Hannah Copeland MRN: [id number] Date of Birth: Nov 20, 1950 Referring Provider (OT): Dr. Marjory Lies   Encounter Date: 10/31/2021   OT End of Session - 10/31/21 0856     Visit Number 8    Number of Visits 25    Date for OT Re-Evaluation 12/19/21    Authorization Type Medicare    Authorization - Visit Number 8    Authorization - Number of Visits 10    Progress Note Due on Visit 10    OT Start Time [phone]    OT Stop Time 0930    OT Time Calculation (min) 39 min    Activity Tolerance Patient tolerated treatment well    Behavior During Therapy [facility] for tasks assessed/performed             Past Medical History:  Diagnosis Date   Arthritis    Hypertension    Parkinson's disease (HCC) 07/13/2020    Past Surgical History:  Procedure Laterality Date   ABDOMINAL HYSTERECTOMY      There were no vitals filed for this visit.   Subjective Assessment - 10/31/21 1218     Subjective  Pt denies pain    Pertinent History Parkinson's- just started meds, DM, HTN    Patient Stated Goals maintain independence    Currently in Pain? No/denies              Sitting, functional reaching in ER and overhead with min cueing for large amplitude movements with hands, use of eye movements and set-up for high reach.  PWR! Hands (basic 4) with min cueing for incr movement amplitude, particularly R thumb and L wrist ext.       OT Education - 10/31/21 0910     Education Details PWR! moves (basic 4) in prone--pt performed demo with min-mod cueing.  PD Exercise Flowsheet and Use/Example.    Person(s) Educated Patient    Methods Explanation;Demonstration;Verbal cues;Handout    Comprehension Verbalized understanding;Returned demonstration;Verbal cues required;Need further instruction               OT Short Term Goals - 10/31/21 0906       OT SHORT TERM GOAL #1   Title I with PD specific HEP    Time 4    Period Weeks    Status On-going    Target Date 10/24/21      OT SHORT TERM GOAL #2   Title Pt will verbalize understanding of adapted strategies to maximize safety and I with ADLs/ IADLs .    Time 4    Period Weeks    Status On-going      OT SHORT TERM GOAL #3   Title Pt will demonstrate ability to retrieve a lightweight object from overhead shelf with  -10 elbow extension with RUE    Time 4    Period Weeks    Status Achieved   10/31/21:  elbow ext WNL     OT SHORT TERM GOAL #4   Title Pt will demonstrate ability to retrieve a lightweight object from overhead shelf with -10 elbow extension with LUE    Time 4    Period Weeks    Status Achieved   10/31/21:  elbow ext WNL     OT SHORT TERM GOAL #5   Title Pt will demonstrate improved fine motor coordination for ADLs  as evidenced by decreasing 9 hole peg test score for RUE by 3 secs    Baseline R-33.47sec    Time 4    Period Weeks    Status Achieved   10/31/21:  29.13, (L-23.13 sec)              OT Long Term Goals - 10/06/21 0915       OT LONG TERM GOAL #1   Title Pt will verbalize understanding of ways to prevent future PD related complications and PD community resources.    Time 12    Period Weeks    Status New      OT LONG TERM GOAL #2   Title Pt will demonstrate improved ease with fastening buttons as evidenced by decreasing 3 button/ unbutton time to 35 secs or less    Time 12    Period Weeks    Status New      OT LONG TERM GOAL #3   Title Pt will demonstrate improved ease with feeding as evidenced by decreasing PPT#2 (self feeding) by 3 secs    Baseline 14.43    Time 12    Period Weeks    Status New                   Plan - 10/31/21 1219     Clinical Impression Statement Pt is progressing with incr movement amplitude.  She needed min cueing for sequencing/technique  for prone PWR! moves, but responds well to cueing.  Pt demo improved R hand coordination and bilateral functional reaching (elbow ext)    OT Occupational Profile and History Problem Focused Assessment - Including review of records relating to presenting problem    Occupational performance deficits (Please refer to evaluation for details): ADL's;IADL's;Leisure    Body Structure / Function / Physical Skills ADL;UE functional use;Balance;Flexibility;FMC;Gait;ROM;GMC;Coordination;Decreased knowledge of precautions;IADL;Decreased knowledge of use of DME;Dexterity;Mobility;Tone;Strength    Rehab Potential Good    OT Frequency 2x / week    OT Duration 12 weeks    OT Treatment/Interventions Self-care/ADL training;Ultrasound;Patient/family education;DME and/or AE instruction;Aquatic Therapy;Paraffin;Passive range of motion;Balance training;Fluidtherapy;Cryotherapy;Splinting;Functional Mobility Training;Electrical Stimulation;Therapeutic exercise;Manual Therapy;Moist Heat;Therapeutic activities;Cognitive remediation/compensation;Neuromuscular education    Plan review prone PWR!, continue to practice large amplitude movements during functional tasks and ADLs strategies, functional reaching    Consulted and Agree with Plan of Care Patient;Family member/caregiver    Family Member Consulted husband             Patient will benefit from skilled therapeutic intervention in order to improve the following deficits and impairments:   Body Structure / Function / Physical Skills: ADL, UE functional use, Balance, Flexibility, FMC, Gait, ROM, GMC, Coordination, Decreased knowledge of precautions, IADL, Decreased knowledge of use of DME, Dexterity, Mobility, Tone, Strength       Visit Diagnosis: Other lack of coordination  Unsteadiness on feet  Other symptoms and signs involving the nervous system  Other symptoms and signs involving the musculoskeletal system  Stiffness of left elbow, not elsewhere  classified  Stiffness of right elbow, not elsewhere classified  Stiffness of right shoulder, not elsewhere classified  Tremor    Problem List Patient Active Problem List   Diagnosis Date Noted   Diabetes (HCC) 10/09/2021   Insomnia 10/09/2021   Parkinson's disease (HCC) 07/13/2020   Hypercholesterolemia 05/25/2016   DJD (degenerative joint disease), lumbosacral 05/23/2016   Osteopenia 05/23/2016   HTN (hypertension) 05/23/2016    Dontae Minerva, OT 10/31/2021, 12:22 PM  [facility]  [address] [place], [place], [address] Phone: [phone]   Fax:  [phone]  Name: Saraiah Bhat MRN: [id number] Date of Birth: 04-29-1951   Willa Frater, OTR/L [facility] [address] [place], [place]  [address] [phone] phone [phone] 10/31/21 12:22 PM

## 2021-10-31 NOTE — Patient Instructions (Addendum)
° ° ° ° ° ° ° ° ° °(  Exercise) Monday Tuesday Wednesday Thursday Friday Saturday Sunday   PWR! Lying on back           PWR! On hands and knees           PWR! On stomach           PWR! sitting           PWR! standing           PWR! hands           Coordination

## 2021-10-31 NOTE — Therapy (Signed)
[facility] [address] [place], [place], [address] Phone: [phone]   Fax:  [phone]  Physical Therapy Treatment  Patient Details  Name: Hannah Copeland MRN: [id number] Date of Birth: 01-Sep-1951 Referring Provider (PT): Penni Bombard, MD   Encounter Date: 10/31/2021   PT End of Session - 10/31/21 1303     Visit Number 6    Number of Visits 9    Date for PT Re-Evaluation 12/03/21    Authorization Type Medicare    PT Start Time 0933    PT Stop Time 1015    PT Time Calculation (min) 42 min    Equipment Utilized During Treatment Gait belt    Activity Tolerance Patient tolerated treatment well    Behavior During Therapy [facility] for tasks assessed/performed             Past Medical History:  Diagnosis Date   Arthritis    Hypertension    Parkinson's disease ([address]) 07/13/2020    Past Surgical History:  Procedure Laterality Date   ABDOMINAL HYSTERECTOMY      There were no vitals filed for this visit.   Subjective Assessment - 10/31/21 0935     Subjective Nothing new. Has been trying the quadruped PWR moves, but wants to make sure she is doing them right.    Pertinent History PMH: PD, HTN, osteopenia, lumbosacral DJD.    Limitations Walking    Patient Stated Goals wants to be able to manage herself, improve her quality of life    Currently in Pain? No/denies                [facility] PT Assessment - 10/31/21 0941       Mini-BESTest   Sit To Stand Normal: Comes to stand without use of hands and stabilizes independently.    Rise to Toes Normal: Stable for 3 s with maximum height.    Stand on one leg (left) Moderate: < 20 s   9.75, 4   Stand on one leg (right) Normal: 20 s.    Stand on one leg - lowest score 1    Compensatory Stepping Correction - Forward Normal: Recovers independently with a single, large step (second realignement is allowed).    Compensatory Stepping Correction - Backward  Normal: Recovers independently with a single, large step    Compensatory Stepping Correction - Left Lateral Normal: Recovers independently with 1 step (crossover or lateral OK)    Compensatory Stepping Correction - Right Lateral Moderate: Several steps to recover equilibrium    Stepping Corredtion Lateral - lowest score 1    Stance - Feet together, eyes open, firm surface  Normal: 30s    Stance - Feet together, eyes closed, foam surface  Normal: 30s    Incline - Eyes Closed Normal: Stands independently 30s and aligns with gravity    Change in Gait Speed Normal: Significantly changes walkling speed without imbalance    Walk with head turns - Horizontal Normal: performs head turns with no change in gait speed and good balance    Walk with pivot turns Normal: Turns with feet close FAST (< 3 steps) with good balance.    Step over obstacles Normal: Able to step over box with minimal change of gait speed and with good balance.    Timed UP & GO with Dual Task Moderate: Dual Task affects either counting OR walking (>10%) when compared to the TUG without Dual Task.    Mini-BEST total score 25  Timed Up and Go Test   TUG Normal TUG    Normal TUG (seconds) 7.5    Cognitive TUG (seconds) 8.87   starting at 77 and backwards by 3, able to count correctly in sitting, incorrect counting during test.                               Pt performs PWR! Moves in quadruped position on floor with yoga mat   (reviewed from HEP last session per pt request)   PWR! Up for improved posture x10 reps - cues to have palms forward   PWR! Rock for improved weighshifting x10 reps    PWR! Twist for improved trunk rotation x5 reps each side    PWR! Step for improved step initiation x5 reps each side - cues to rock back first and then take a BIG STEP, keep palms on the ground the whole time.    Cues provided for technique and intensity.       PT Education - 10/31/21 1303     Education Details  Provided handout on new PWR moves class on Friday, reviewed quadruped PWR moves to [employer]) Educated Patient    Methods Explanation;Demonstration;Handout;Verbal cues    Comprehension Verbalized understanding;Returned demonstration              PT Short Term Goals - 10/04/21 1205       PT SHORT TERM GOAL #1   Title ALL STGS = LTGS               PT Long Term Goals - 10/31/21 0956       PT LONG TERM GOAL #1   Title Pt will be independent with PD specific HEP in order to build upon functional gains made in therapy. ALL LTGS DUE 11/01/21    Time 4    Period Weeks    Status New    Target Date 11/01/21      PT LONG TERM GOAL #2   Title Pt will verbalize understanding of local Parkinson's disease resources, including options for continued community fitness.    Time 4    Period Weeks    Status New      PT LONG TERM GOAL #3   Title Pt will verbalize understanding of transition to appropriate community fitness classes upon d/c from PT to maximize gains made in therapy.    Time 4    Period Weeks    Status New      PT LONG TERM GOAL #4   Title Pt will improve TUG and TUG cog score to less than or equal to 10% difference for improved dual task/decreased fall risk.    Baseline TUG 11.78, cog TUG 13.2, TUG 7.5 - cog TUG 8.87 on 10/31/21    Time 4    Period Weeks    Status Not Met      PT LONG TERM GOAL #5   Title Pt will improve miniBEST to at least a 25/28 in order to demo decr fall risk.    Baseline 22/28; 25/28 on 10/31/21    Time 4    Period Weeks    Status Achieved      PT LONG TERM GOAL #6   Title 6MWT to be assessed with LTG written.    Baseline 1433' - LTG not needed    Time 4    Period Weeks    Status Achieved  Plan - 10/31/21 1309     Clinical Impression Statement Began to check pt's LTGs today with pt meeting LTG #5 in regards to miniBEST, improved to 25/28 (previously was 22/28). Pt did not meet LTG #4 in regards to TUG/cog  TUG, improvements since eval, but not to goal level, indicating continued difficulty with dual tasking. Remainder of session focused on reviewing quadruped PWR moves given as HEP from last visit, needing intermittent cues for technique and intensity of movement, but pt performing well.    Personal Factors and Comorbidities Comorbidity 3+    Comorbidities PD, HTN, osteopenia    Examination-Activity Limitations Stairs;Locomotion Level    Examination-Participation Restrictions Community Activity    Stability/Clinical Decision Making Stable/Uncomplicated    Rehab Potential Good    PT Frequency 2x / week    PT Duration 8 weeks    PT Treatment/Interventions ADLs/Self Care Home Management;Gait training;Stair training;Functional mobility training;Therapeutic activities;Neuromuscular re-education;Balance training;Therapeutic exercise;Patient/family education;Vestibular    PT Next Visit Plan standing PWR flow. mod quadruped PWR moves- review as needed. gait training with arm swing/R foot clearance. high level balance - SLS, unlevel surfaces. stepping strategies esp posterior/lateral. warm up on SciFit. check remainder of LTGs and anticipate D/C next week.    PT Home Exercise Plan standing PWR moves, posterior stepping, quadruped PWR moves    Consulted and Agree with Plan of Care Patient;Family member/caregiver             Patient will benefit from skilled therapeutic intervention in order to improve the following deficits and impairments:  Abnormal gait, Decreased balance, Impaired flexibility, Decreased strength, Decreased coordination, Postural dysfunction  Visit Diagnosis: Other lack of coordination  Unsteadiness on feet  Other symptoms and signs involving the nervous system  Other abnormalities of gait and mobility     Problem List Patient Active Problem List   Diagnosis Date Noted   Diabetes ([place]) 10/09/2021   Insomnia 10/09/2021   Parkinson's disease ([place]) 07/13/2020    Hypercholesterolemia 05/25/2016   DJD (degenerative joint disease), lumbosacral 05/23/2016   Osteopenia 05/23/2016   HTN (hypertension) 05/23/2016    Arliss Journey, PT, DPT 10/31/2021, 1:12 PM  [facility] [address] [place], [place], [address] Phone: [phone]   Fax:  [phone]  Name: Hannah Copeland MRN: [id number] Date of Birth: 1950/12/06

## 2021-11-03 ENCOUNTER — Ambulatory Visit: Payer: Medicare Other | Admitting: Physical Therapy

## 2021-11-03 ENCOUNTER — Ambulatory Visit: Payer: Medicare Other | Admitting: Occupational Therapy

## 2021-11-03 ENCOUNTER — Other Ambulatory Visit: Payer: Self-pay

## 2021-11-03 ENCOUNTER — Encounter: Payer: Self-pay | Admitting: Physical Therapy

## 2021-11-03 ENCOUNTER — Encounter: Payer: Self-pay | Admitting: Occupational Therapy

## 2021-11-03 DIAGNOSIS — R2681 Unsteadiness on feet: Secondary | ICD-10-CM

## 2021-11-03 DIAGNOSIS — R2689 Other abnormalities of gait and mobility: Secondary | ICD-10-CM

## 2021-11-03 DIAGNOSIS — R278 Other lack of coordination: Secondary | ICD-10-CM | POA: Diagnosis not present

## 2021-11-03 DIAGNOSIS — M25611 Stiffness of right shoulder, not elsewhere classified: Secondary | ICD-10-CM

## 2021-11-03 DIAGNOSIS — M25621 Stiffness of right elbow, not elsewhere classified: Secondary | ICD-10-CM

## 2021-11-03 DIAGNOSIS — R29818 Other symptoms and signs involving the nervous system: Secondary | ICD-10-CM

## 2021-11-03 DIAGNOSIS — R251 Tremor, unspecified: Secondary | ICD-10-CM

## 2021-11-03 DIAGNOSIS — R29898 Other symptoms and signs involving the musculoskeletal system: Secondary | ICD-10-CM

## 2021-11-03 DIAGNOSIS — M25622 Stiffness of left elbow, not elsewhere classified: Secondary | ICD-10-CM

## 2021-11-03 NOTE — Therapy (Signed)
[facility] [address] [place], [place], [address] Phone: [phone]   Fax:  [phone]  Physical Therapy Treatment  Patient Details  Name: Hannah Copeland MRN: [id number] Date of Birth: Apr 16, 1951 Referring Provider (PT): Penni Bombard, MD   Encounter Date: 11/03/2021   PT End of Session - 11/03/21 0806     Visit Number 7    Number of Visits 9    Date for PT Re-Evaluation 12/03/21    Authorization Type Medicare    PT Start Time 0803    Equipment Utilized During Treatment Gait belt    Activity Tolerance Patient tolerated treatment well    Behavior During Therapy [facility] for tasks assessed/performed             Past Medical History:  Diagnosis Date   Arthritis    Hypertension    Parkinson's disease ([place]) 07/13/2020    Past Surgical History:  Procedure Laterality Date   ABDOMINAL HYSTERECTOMY      There were no vitals filed for this visit.   Subjective Assessment - 11/03/21 0803     Subjective Has been working on the different stepping strategies at home.    Pertinent History PMH: PD, HTN, osteopenia, lumbosacral DJD.    Limitations Walking    Patient Stated Goals wants to be able to manage herself, improve her quality of life    Currently in Pain? No/denies                               [facility] Adult PT Treatment/Exercise - 11/03/21 6962       Neuro Re-ed    Neuro Re-ed Details  Multi-directional stepping following sheet with use of 2" obstacles for incr foot clearance, performed x4 reps. Needing multi-modal cues for technique and sequencing and weight shifting (tactile cues at times for weight shifting posterior/lateral).      Exercises   Other Exercises  SciFit with BLE/BUE at gear 2.7 for 6 minutes for aerobic activity, reciprocal movement. Pt reporting effort level as a 8/10. Discussed options for aerobic activity after therapy -pt does not have access to a seated bike  (but does have a [place] in [place]). Discussed importance of big effort walking as aerobic exercise going forwards after D/C.             Pt performs PWR! Moves in standing position:    PWR! Up for improved posture x5 reps   PWR! Rock for improved weighshifting x5 reps each side   PWR! Twist for improved trunk rotation  x5 reps each side - needs initial cues to reset with tall posture in the midline.   PWR! Step for improved step initiation x5 reps each side     Standing PWR Flow Up > Rock > Twist > Step x5 reps   Pt needing verbal, demo cues for technique, sequencing and keeping a wider BOS initially.     Balance Exercises - 11/03/21 0837       Balance Exercises: Standing   Stepping Strategy Posterior;10 reps;UE support;Limitations;Anterior    Stepping Strategy Limitations Alternating posterior stepping with cues for technique and weight shift (reviewed from HEP). x10 reps each side with UE support forward and then backwards step (initial cues for incr step length posteriorly)    Other Standing Exercises Going down and back 6 stepping stones x3 reps with focus on slowed pace for SLS and deliberate arm swing.  PT Education - 11/03/21 [phone]     Education Details Discussed PD screens in 6 months after D/C for PT,OT,ST    Person(s) Educated Patient    Methods Explanation    Comprehension Verbalized understanding              PT Short Term Goals - 10/04/21 1205       PT SHORT TERM GOAL #1   Title ALL STGS = LTGS               PT Long Term Goals - 11/03/21 0900       PT LONG TERM GOAL #1   Title Pt will be independent with PD specific HEP in order to build upon functional gains made in therapy. ALL LTGS DUE 11/01/21    Time 4    Period Weeks    Status New    Target Date 11/01/21      PT LONG TERM GOAL #2   Title Pt will verbalize understanding of local Parkinson's disease resources, including options for continued community fitness.     Baseline pt has handout, but is depending on transportation and pt will be going back to [place] soon to see family.    Time 4    Period Weeks    Status Achieved      PT LONG TERM GOAL #3   Title Pt will verbalize understanding of transition to appropriate community fitness classes upon d/c from PT to maximize gains made in therapy.    Baseline pt has handout and discussed options with patient, but is depending on transportation and pt will be going back to [place] soon to see family.    Time 4    Period Weeks    Status Deferred      PT LONG TERM GOAL #4   Title Pt will improve TUG and TUG cog score to less than or equal to 10% difference for improved dual task/decreased fall risk.    Baseline TUG 11.78, cog TUG 13.2, TUG 7.5 - cog TUG 8.87 on 10/31/21    Time 4    Period Weeks    Status Not Met      PT LONG TERM GOAL #5   Title Pt will improve miniBEST to at least a 25/28 in order to demo decr fall risk.    Baseline 22/28; 25/28 on 10/31/21    Time 4    Period Weeks    Status Achieved      PT LONG TERM GOAL #6   Title 6MWT to be assessed with LTG written.    Baseline 1433' - LTG not needed    Time 4    Period Weeks    Status Achieved                   Plan - 11/03/21 0857     Clinical Impression Statement Trialed standing PWR flow today with pt needing verbal/demo cues throughout for proper sequencing and to have a wider BOS initially. Did have initial difficulty with weight shifting and sequencing multi-directional stepping following direction sheet, but did improve with incr reps. Discussed plan for D/C at end of next week and plan for return PD screens. LTGs ongoing/updated as appropriate for 8 week POC.    Personal Factors and Comorbidities Comorbidity 3+    Comorbidities PD, HTN, osteopenia    Examination-Activity Limitations Stairs;Locomotion Level    Examination-Participation Restrictions Community Activity    Stability/Clinical Decision Making  Stable/Uncomplicated    Rehab Potential  Good    PT Frequency 2x / week    PT Duration 8 weeks    PT Treatment/Interventions ADLs/Self Care Home Management;Gait training;Stair training;Functional mobility training;Therapeutic activities;Neuromuscular re-education;Balance training;Therapeutic exercise;Patient/family education;Vestibular    PT Next Visit Plan standing PWR flow. mod quadruped PWR moves- review as needed. gait training with arm swing/R foot clearance-provide program. high level balance - SLS, unlevel surfaces. stepping strategies esp posterior/lateral. warm up on SciFit. check remainder of LTGs and anticipate D/C next week.    PT Home Exercise Plan standing PWR moves, posterior stepping, quadruped PWR moves    Consulted and Agree with Plan of Care Patient;Family member/caregiver             Patient will benefit from skilled therapeutic intervention in order to improve the following deficits and impairments:  Abnormal gait, Decreased balance, Impaired flexibility, Decreased strength, Decreased coordination, Postural dysfunction  Visit Diagnosis: Other lack of coordination  Unsteadiness on feet  Other symptoms and signs involving the nervous system  Other abnormalities of gait and mobility     Problem List Patient Active Problem List   Diagnosis Date Noted   Diabetes ([place]) 10/09/2021   Insomnia 10/09/2021   Parkinson's disease ([place]) 07/13/2020   Hypercholesterolemia 05/25/2016   DJD (degenerative joint disease), lumbosacral 05/23/2016   Osteopenia 05/23/2016   HTN (hypertension) 05/23/2016    Arliss Journey, PT, DPT  11/03/2021, 9:01 AM  [facility] [address] [place], [place], [address] Phone: [phone]   Fax:  [phone]  Name: Aubriel Khanna MRN: [id number] Date of Birth: 1950-11-11

## 2021-11-03 NOTE — Therapy (Signed)
[facility] [address] [place], [place], [address] Phone: [phone]   Fax:  [phone]  Occupational Therapy Treatment  Patient Details  Name: Hannah Copeland MRN: [id number] Date of Birth: [birth year] Referring Provider (OT): Dr. Marjory Lies   Encounter Date: 11/03/2021   OT End of Session - 11/03/21 1151     Visit Number 9    Number of Visits 25    Authorization Type Medicare    Authorization - Visit Number 9    Authorization - Number of Visits 10    OT Start Time 0720    OT Stop Time 0800    OT Time Calculation (min) 40 min             Past Medical History:  Diagnosis Date   Arthritis    Hypertension    Parkinson's disease (HCC) 07/13/2020    Past Surgical History:  Procedure Laterality Date   ABDOMINAL HYSTERECTOMY      There were no vitals filed for this visit.              Treatment: dynamic  step and reach to targets for improved step length and foot positioning while copying peg design for cognitive and coordination component  with left and right UE's min -mod v.c for amplitude and foot placement, increased time required  Arm bike x 5 mins level 1 for continuing, min v.c for speed              OT Education - 11/03/21 0811     Education Details PWR! moves (basic 4) in prone--pt performed demo with min-mod cueing.    Person(s) Educated Patient    Methods Explanation;Demonstration;Verbal cues    Comprehension Verbalized understanding;Returned demonstration;Verbal cues required              OT Short Term Goals - 10/31/21 0906       OT SHORT TERM GOAL #1   Title I with PD specific HEP    Time 4    Period Weeks    Status On-going    Target Date 10/24/21      OT SHORT TERM GOAL #2   Title Pt will verbalize understanding of adapted strategies to maximize safety and I with ADLs/ IADLs .    Time 4    Period Weeks    Status On-going      OT SHORT TERM GOAL #3    Title Pt will demonstrate ability to retrieve a lightweight object from overhead shelf with  -10 elbow extension with RUE    Time 4    Period Weeks    Status Achieved   10/31/21:  elbow ext WNL     OT SHORT TERM GOAL #4   Title Pt will demonstrate ability to retrieve a lightweight object from overhead shelf with -10 elbow extension with LUE    Time 4    Period Weeks    Status Achieved   10/31/21:  elbow ext WNL     OT SHORT TERM GOAL #5   Title Pt will demonstrate improved fine motor coordination for ADLs as evidenced by decreasing 9 hole peg test score for RUE by 3 secs    Baseline R-33.47sec    Time 4    Period Weeks    Status Achieved   10/31/21:  29.13, (L-23.13 sec)              OT Long Term Goals - 10/06/21 0915       OT  LONG TERM GOAL #1   Title Pt will verbalize understanding of ways to prevent future PD related complications and PD community resources.    Time 12    Period Weeks    Status New      OT LONG TERM GOAL #2   Title Pt will demonstrate improved ease with fastening buttons as evidenced by decreasing 3 button/ unbutton time to 35 secs or less    Time 12    Period Weeks    Status New      OT LONG TERM GOAL #3   Title Pt will demonstrate improved ease with feeding as evidenced by decreasing PPT#2 (self feeding) by 3 secs    Baseline 14.43    Time 12    Period Weeks    Status New                   Plan - 11/03/21 1152     Clinical Impression Statement Pt is progressing towards goals. She demonstrates improving amplitude with functional activities however she requires v.c to keep feet apart when stepping. Pt would like to wrap up next week.    OT Occupational Profile and History Problem Focused Assessment - Including review of records relating to presenting problem    Occupational performance deficits (Please refer to evaluation for details): ADL's;IADL's;Leisure    Body Structure / Function / Physical Skills ADL;UE functional  use;Balance;Flexibility;FMC;Gait;ROM;GMC;Coordination;Decreased knowledge of precautions;IADL;Decreased knowledge of use of DME;Dexterity;Mobility;Tone;Strength    Rehab Potential Good    OT Frequency 2x / week    OT Duration 12 weeks    OT Treatment/Interventions Self-care/ADL training;Ultrasound;Patient/family education;DME and/or AE instruction;Aquatic Therapy;Paraffin;Passive range of motion;Balance training;Fluidtherapy;Cryotherapy;Splinting;Functional Mobility Training;Electrical Stimulation;Therapeutic exercise;Manual Therapy;Moist Heat;Therapeutic activities;Cognitive remediation/compensation;Neuromuscular education    Plan 10th visit PN continue to practice large amplitude movements during functional tasks and ADLs strategies, functional reaching    Consulted and Agree with Plan of Care Patient    Family Member Consulted husband             Patient will benefit from skilled therapeutic intervention in order to improve the following deficits and impairments:   Body Structure / Function / Physical Skills: ADL, UE functional use, Balance, Flexibility, FMC, Gait, ROM, GMC, Coordination, Decreased knowledge of precautions, IADL, Decreased knowledge of use of DME, Dexterity, Mobility, Tone, Strength       Visit Diagnosis: Other lack of coordination  Unsteadiness on feet  Other symptoms and signs involving the nervous system  Other symptoms and signs involving the musculoskeletal system  Stiffness of left elbow, not elsewhere classified  Other abnormalities of gait and mobility  Stiffness of right elbow, not elsewhere classified  Stiffness of right shoulder, not elsewhere classified  Tremor    Problem List Patient Active Problem List   Diagnosis Date Noted   Diabetes (HCC) 10/09/2021   Insomnia 10/09/2021   Parkinson's disease (HCC) 07/13/2020   Hypercholesterolemia 05/25/2016   DJD (degenerative joint disease), 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 [place], [place], [address] Phone: [phone]   Fax:  [phone]  Name: Hannah Copeland MRN: [id number] Date of Birth: Apr 06, 1951

## 2021-11-07 ENCOUNTER — Ambulatory Visit: Payer: Medicare Other | Admitting: Occupational Therapy

## 2021-11-07 ENCOUNTER — Other Ambulatory Visit: Payer: Self-pay

## 2021-11-07 ENCOUNTER — Ambulatory Visit: Payer: Medicare Other | Admitting: Physical Therapy

## 2021-11-07 ENCOUNTER — Encounter: Payer: Self-pay | Admitting: Physical Therapy

## 2021-11-07 DIAGNOSIS — R2681 Unsteadiness on feet: Secondary | ICD-10-CM

## 2021-11-07 DIAGNOSIS — R278 Other lack of coordination: Secondary | ICD-10-CM | POA: Diagnosis not present

## 2021-11-07 DIAGNOSIS — R2689 Other abnormalities of gait and mobility: Secondary | ICD-10-CM

## 2021-11-07 DIAGNOSIS — R29818 Other symptoms and signs involving the nervous system: Secondary | ICD-10-CM

## 2021-11-07 DIAGNOSIS — M25611 Stiffness of right shoulder, not elsewhere classified: Secondary | ICD-10-CM

## 2021-11-07 DIAGNOSIS — M25621 Stiffness of right elbow, not elsewhere classified: Secondary | ICD-10-CM

## 2021-11-07 DIAGNOSIS — R29898 Other symptoms and signs involving the musculoskeletal system: Secondary | ICD-10-CM

## 2021-11-07 DIAGNOSIS — M25622 Stiffness of left elbow, not elsewhere classified: Secondary | ICD-10-CM

## 2021-11-07 NOTE — Therapy (Signed)
[facility] [address] [place], [place], [address] Phone: [phone]   Fax:  [phone]  Physical Therapy Treatment  Patient Details  Name: Hannah Copeland MRN: [id number] Date of Birth: 1951/03/26 Referring Provider (PT): Penni Bombard, MD   Encounter Date: 11/07/2021   PT End of Session - 11/07/21 0933     Visit Number 8    Number of Visits 9    Date for PT Re-Evaluation 12/03/21    Authorization Type Medicare    PT Start Time 0930    PT Stop Time 1009    PT Time Calculation (min) 39 min    Equipment Utilized During Treatment Gait belt    Activity Tolerance Patient tolerated treatment well    Behavior During Therapy [facility] for tasks assessed/performed             Past Medical History:  Diagnosis Date   Arthritis    Hypertension    Parkinson's disease ([place]) 07/13/2020    Past Surgical History:  Procedure Laterality Date   ABDOMINAL HYSTERECTOMY      There were no vitals filed for this visit.   Subjective Assessment - 11/07/21 0932     Subjective Still having difficulty with holding objects in her right hand and having increased tremors.    Pertinent History PMH: PD, HTN, osteopenia, lumbosacral DJD.    Limitations Walking    Patient Stated Goals wants to be able to manage herself, improve her quality of life    Currently in Pain? No/denies                               [facility] Adult PT Treatment/Exercise - 11/07/21 0955       Ambulation/Gait   Ambulation/Gait Yes    Ambulation/Gait Assistance 5: Supervision    Ambulation/Gait Assistance Details 2 laps with pt tossing scarves - working on opening up hands, extending arms, and larger amplitude movement patterns. Cues to slow down at times to focus on incr stride length during gait.    Ambulation Distance (Feet) 230 Feet    Assistive device None    Gait Pattern Step-through pattern;Decreased arm swing - right;Decreased  trunk rotation    Ambulation Surface Level;Indoor      High Level Balance   High Level Balance Activities Backward walking;Marching forwards    High Level Balance Comments Tossing ball with other PT walking forwards and backwards then adding in cognitive challenge - naming foods in alphabetical order from A-Z, with boomwhackers in each hand working on reciprocal marching with UE lift and then adding in trunk rotations when trying to bring boomwhacker across to opposite knee - total of 115'. Pt more challenged with trunk rotations.      Neuro Re-ed    Neuro Re-ed Details  Alternating anterior stepping strategy when tossing scarves for larger amplitude movement x10 reps each side. Cues for opening up hands when tossing and catching.              Pt performs PWR! Moves in quadruped position on floor with yoga mat     PWR! Up for improved posture x10 reps - cues to have palms forward   PWR! Rock for improved weighshifting x10 reps    PWR! Twist for improved trunk rotation x5 reps each side    PWR! Step for improved step initiation x5 reps each side - cues to rock back first and then take a  BIG STEP, keep palms on the ground the whole time.    Cues provided for technique and intensity.  Also showed pt where to access videos on YouTube for PWR moves for incr carryover at home as pt tends to do better with visual/demo cues.        PT Education - 11/07/21 1051     Education Details Plan for D/C at next session with PD screens in 6 months.    Person(s) Educated Patient    Methods Explanation    Comprehension Verbalized understanding              PT Short Term Goals - 10/04/21 1205       PT SHORT TERM GOAL #1   Title ALL STGS = LTGS               PT Long Term Goals - 11/03/21 0901       PT LONG TERM GOAL #1   Title Pt will be independent with PD specific HEP in order to build upon functional gains made in therapy. ALL LTGS DUE 12/01/21    Time 8    Period Weeks     Status On-going    Target Date 12/01/21      PT LONG TERM GOAL #2   Title Pt will verbalize understanding of local Parkinson's disease resources, including options for continued community fitness.    Baseline pt has handout, but is depending on transportation and pt will be going back to [place] soon to see family.    Time 8    Period Weeks    Status Achieved      PT LONG TERM GOAL #3   Title Pt will verbalize understanding of transition to appropriate community fitness classes upon d/c from PT to maximize gains made in therapy.    Baseline pt has handout and discussed options with patient, but is depending on transportation and pt will be going back to [place] soon to see family.    Time 8    Period Weeks    Status Deferred      PT LONG TERM GOAL #4   Title Pt will improve TUG and TUG cog score to less than or equal to 10% difference for improved dual task/decreased fall risk.    Baseline TUG 11.78, cog TUG 13.2, TUG 7.5 - cog TUG 8.87 on 10/31/21    Time 8    Period Weeks    Status Not Met      PT LONG TERM GOAL #5   Title Pt will improve miniBEST to at least a 25/28 in order to demo decr fall risk.    Baseline 22/28; 25/28 on 10/31/21    Time 8    Period Weeks    Status Achieved      PT LONG TERM GOAL #6   Title 6MWT to be assessed with LTG written.    Baseline 1433' - LTG not needed    Time 8    Period Weeks    Status Achieved                   Plan - 11/07/21 1053     Clinical Impression Statement Reviewed quadruped PWR moves for HEP. Pt does better with visual/demo cues with exercises - showed pt how to access videos for PWR moves on YouTube with pt appreciative of information. Remainder of session worked on high level balance and dual tasking with manual/cognitive tasks. Pt tolerated well, had incr challenge with  reciprocal movement patterns with marching. Will plan to D/C at next session with pt in agreement.    Personal Factors and Comorbidities  Comorbidity 3+    Comorbidities PD, HTN, osteopenia    Examination-Activity Limitations Stairs;Locomotion Level    Examination-Participation Restrictions Community Activity    Stability/Clinical Decision Making Stable/Uncomplicated    Rehab Potential Good    PT Frequency 2x / week    PT Duration 8 weeks    PT Treatment/Interventions ADLs/Self Care Home Management;Gait training;Stair training;Functional mobility training;Therapeutic activities;Neuromuscular re-education;Balance training;Therapeutic exercise;Patient/family education;Vestibular    PT Next Visit Plan check remainder of goals, review HEP, plan for D/C and schedule for PD screens.    PT Home Exercise Plan standing PWR moves, posterior stepping, quadruped PWR moves    Consulted and Agree with Plan of Care Patient;Family member/caregiver             Patient will benefit from skilled therapeutic intervention in order to improve the following deficits and impairments:  Abnormal gait, Decreased balance, Impaired flexibility, Decreased strength, Decreased coordination, Postural dysfunction  Visit Diagnosis: Other lack of coordination  Other symptoms and signs involving the nervous system  Unsteadiness on feet     Problem List Patient Active Problem List   Diagnosis Date Noted   Diabetes ([place]) 10/09/2021   Insomnia 10/09/2021   Parkinson's disease ([place]) 07/13/2020   Hypercholesterolemia 05/25/2016   DJD (degenerative joint disease), lumbosacral 05/23/2016   Osteopenia 05/23/2016   HTN (hypertension) 05/23/2016    Arliss Journey, PT, DPT  11/07/2021, 10:56 AM  [facility] [address] [place], [place], [address] Phone: [phone]   Fax:  [phone]  Name: Merlin Ege MRN: [id number] Date of Birth: 06/06/1951

## 2021-11-07 NOTE — Therapy (Signed)
[facility] [address] [place], [place], [address] Phone: [phone]   Fax:  [phone]  Occupational Therapy Treatment  Patient Details  Name: Hannah Copeland MRN: [id number] Date of Birth: 05-07-1951 Referring Provider (OT): Dr. Leta Baptist   Encounter Date: 11/07/2021   OT End of Session - 11/07/21 1141     Visit Number 10    Number of Visits 25    Authorization Type Medicare    Authorization - Visit Number 10    Authorization - Number of Visits 10    OT Start Time 0850    OT Stop Time 0930    OT Time Calculation (min) 40 min    Activity Tolerance Patient tolerated treatment well    Behavior During Therapy [facility] for tasks assessed/performed             Past Medical History:  Diagnosis Date   Arthritis    Hypertension    Parkinson's disease ([place]) 07/13/2020    Past Surgical History:  Procedure Laterality Date   ABDOMINAL HYSTERECTOMY      There were no vitals filed for this visit.   Subjective Assessment - 11/07/21 1153     Subjective  Pt denies pain    Pertinent History Parkinson's- just started meds, DM, HTN    Patient Stated Goals maintain independence    Currently in Pain? No/denies                [facility] OT Assessment - 11/07/21 0001       Observation/Other Assessments   Simulated Eating Time (seconds) 11.56                 Treatment: Seated functional reaching with trunk rotation, with bilateral UE's, min v.c for amplitude.             OT Education - 11/07/21 1137     Education Details PWR! moves (basic 4) in prone--pt returned demonstration, min v.c initally, then pt performed, handout for ways to prevent future PD complication and community resources-issued and reviewed. Pt practiced PWR! hands basic 4-min v.c then fastened buttons using big movements. Simulated feeding task, min v.c    Person(s) Educated Patient    Methods Explanation;Demonstration;Verbal  cues;Handout    Comprehension Verbalized understanding;Returned demonstration;Verbal cues required              OT Short Term Goals - 11/07/21 1149       OT SHORT TERM GOAL #1   Title I with PD specific HEP    Time 4    Period Weeks    Status Achieved    Target Date 10/24/21      OT SHORT TERM GOAL #2   Title Pt will verbalize understanding of adapted strategies to maximize safety and I with ADLs/ IADLs .    Time 4    Period Weeks    Status Achieved      OT SHORT TERM GOAL #3   Title Pt will demonstrate ability to retrieve a lightweight object from overhead shelf with  -10 elbow extension with RUE    Time 4    Period Weeks    Status Achieved   10/31/21:  elbow ext WNL     OT SHORT TERM GOAL #4   Title Pt will demonstrate ability to retrieve a lightweight object from overhead shelf with -10 elbow extension with LUE    Time 4    Period Weeks    Status Achieved   10/31/21:  elbow ext WNL     OT SHORT TERM GOAL #5   Title Pt will demonstrate improved fine motor coordination for ADLs as evidenced by decreasing 9 hole peg test score for RUE by 3 secs    Baseline R-33.47sec    Time 4    Period Weeks    Status Achieved   10/31/21:  29.13, (L-23.13 sec)              OT Long Term Goals - 11/07/21 1149       OT LONG TERM GOAL #1   Title Pt will verbalize understanding of ways to prevent future PD related complications and PD community resources.    Time 12    Period Weeks    Status Achieved      OT LONG TERM GOAL #2   Title Pt will demonstrate improved ease with fastening buttons as evidenced by decreasing 3 button/ unbutton time to 35 secs or less    Time 12    Period Weeks    Status Achieved   30.82     OT LONG TERM GOAL #3   Title Pt will demonstrate improved ease with feeding as evidenced by decreasing PPT#2 (self feeding) by 3 secs    Baseline 14.43    Time 12    Period Weeks    Status On-going   11.57, 9.53                  Plan - 11/07/21  1140     Clinical Impression Statement For the reporting period of 09/26/21-11/07/21 Pt demonstrates good overall progress.she has achieved all short term goals and several long term goals. Pt to be seen for 1 additional visit to reinforce education and to check remaining goals. She agrees with plans for d/c next visit. She will need to schedule PD screens in 6-8 mons.    OT Occupational Profile and History Problem Focused Assessment - Including review of records relating to presenting problem    Occupational performance deficits (Please refer to evaluation for details): ADL's;IADL's;Leisure    Body Structure / Function / Physical Skills ADL;UE functional use;Balance;Flexibility;FMC;Gait;ROM;GMC;Coordination;Decreased knowledge of precautions;IADL;Decreased knowledge of use of DME;Dexterity;Mobility;Tone;Strength    Rehab Potential Good    OT Frequency 2x / week    OT Duration 12 weeks    OT Treatment/Interventions Self-care/ADL training;Ultrasound;Patient/family education;DME and/or AE instruction;Aquatic Therapy;Paraffin;Passive range of motion;Balance training;Fluidtherapy;Cryotherapy;Splinting;Functional Mobility Training;Electrical Stimulation;Therapeutic exercise;Manual Therapy;Moist Heat;Therapeutic activities;Cognitive remediation/compensation;Neuromuscular education    Plan finish checking goals and d/c    Consulted and Agree with Plan of Care Patient             Patient will benefit from skilled therapeutic intervention in order to improve the following deficits and impairments:   Body Structure / Function / Physical Skills: ADL, UE functional use, Balance, Flexibility, FMC, Gait, ROM, GMC, Coordination, Decreased knowledge of precautions, IADL, Decreased knowledge of use of DME, Dexterity, Mobility, Tone, Strength       Visit Diagnosis: Other lack of coordination  Unsteadiness on feet  Other symptoms and signs involving the nervous system  Other abnormalities of gait and  mobility  Other symptoms and signs involving the musculoskeletal system  Stiffness of left elbow, not elsewhere classified  Stiffness of right elbow, not elsewhere classified  Stiffness of right shoulder, not elsewhere classified    Problem List Patient Active Problem List   Diagnosis Date Noted   Diabetes ([place]) 10/09/2021   Insomnia 10/09/2021   Parkinson's disease ([place]) 07/13/2020  Hypercholesterolemia 05/25/2016   DJD (degenerative joint disease), lumbosacral 05/23/2016   Osteopenia 05/23/2016   HTN (hypertension) 05/23/2016    Alliah Boulanger, OT 11/07/2021, 11:56 AM  [facility] [address] [place], [place], [address] Phone: [phone]   Fax:  [phone]  Name: Makyia Strouth MRN: [id number] Date of Birth: [birth year]

## 2021-11-07 NOTE — Patient Instructions (Signed)

## 2021-11-09 ENCOUNTER — Ambulatory Visit: Payer: Medicare Other | Admitting: Physical Therapy

## 2021-11-09 ENCOUNTER — Other Ambulatory Visit: Payer: Self-pay

## 2021-11-09 ENCOUNTER — Ambulatory Visit: Payer: Medicare Other | Admitting: Occupational Therapy

## 2021-11-09 ENCOUNTER — Encounter: Payer: Self-pay | Admitting: Physical Therapy

## 2021-11-09 DIAGNOSIS — M25621 Stiffness of right elbow, not elsewhere classified: Secondary | ICD-10-CM

## 2021-11-09 DIAGNOSIS — R2681 Unsteadiness on feet: Secondary | ICD-10-CM

## 2021-11-09 DIAGNOSIS — M25611 Stiffness of right shoulder, not elsewhere classified: Secondary | ICD-10-CM

## 2021-11-09 DIAGNOSIS — R2689 Other abnormalities of gait and mobility: Secondary | ICD-10-CM

## 2021-11-09 DIAGNOSIS — R29818 Other symptoms and signs involving the nervous system: Secondary | ICD-10-CM

## 2021-11-09 DIAGNOSIS — M25622 Stiffness of left elbow, not elsewhere classified: Secondary | ICD-10-CM

## 2021-11-09 DIAGNOSIS — R278 Other lack of coordination: Secondary | ICD-10-CM

## 2021-11-09 DIAGNOSIS — R29898 Other symptoms and signs involving the musculoskeletal system: Secondary | ICD-10-CM

## 2021-11-09 DIAGNOSIS — R251 Tremor, unspecified: Secondary | ICD-10-CM

## 2021-11-09 NOTE — Therapy (Signed)
[facility] [address] [place], [place], [address] Phone: [phone]   Fax:  [phone]  Occupational Therapy Treatment  Patient Details  Name: Hannah Copeland MRN: [id number] Date of Birth: 29-Jul-1951 Referring Provider (OT): Dr. Leta Baptist   Encounter Date: 11/09/2021 OCCUPATIONAL THERAPY DISCHARGE SUMMARY    Current functional level related to goals / functional outcomes: Pt met all goals.   Remaining deficits: Bradykinesia, decreased coordination, decreased balance, decreased functional mobility   Education / Equipment: Pt was educated regarding: HEP, ADL strategies, ways to prevent future complications and community resources, Pt verbalized understanding of all education.  Patient agrees to discharge. Patient goals were met. Patient is being discharged due to meeting the stated rehab goals.. Recommend PD screens in 6 mons.      OT End of Session - 11/09/21 0855     Visit Number 11    Number of Visits 25    Date for OT Re-Evaluation 12/19/21    Authorization Type Medicare    Authorization - Visit Number 11    OT Start Time 0848    OT Stop Time 0928    OT Time Calculation (min) 40 min    Activity Tolerance Patient tolerated treatment well    Behavior During Therapy [facility] for tasks assessed/performed             Past Medical History:  Diagnosis Date   Arthritis    Hypertension    Parkinson's disease ([place]) 07/13/2020    Past Surgical History:  Procedure Laterality Date   ABDOMINAL HYSTERECTOMY      There were no vitals filed for this visit.   Subjective Assessment - 11/09/21 1601     Subjective  Pt denies pain    Pertinent History Parkinson's- just started meds, DM, HTN    Patient Stated Goals maintain independence    Currently in Pain? No/denies                          Treatment:Arm bike x 5 mins level 1 v.c. for speed.        OT Education - 11/09/21 0913      Education Details PWR! moves (basic 4) in seated --pt returned demonstration, PWR! hands, reviewed  coordination HEP- flipping and dealing cards, stacking coins and manipulating in hand, Reviewed strategies for self feeding and checked progress towards remaining goal.    Person(s) Educated Patient    Methods Explanation;Demonstration;Verbal cues    Comprehension Verbalized understanding;Returned demonstration;Verbal cues required              OT Short Term Goals - 11/07/21 1149       OT SHORT TERM GOAL #1   Title I with PD specific HEP    Time 4    Period Weeks    Status Achieved    Target Date 10/24/21      OT SHORT TERM GOAL #2   Title Pt will verbalize understanding of adapted strategies to maximize safety and I with ADLs/ IADLs .    Time 4    Period Weeks    Status Achieved      OT SHORT TERM GOAL #3   Title Pt will demonstrate ability to retrieve a lightweight object from overhead shelf with  -10 elbow extension with RUE    Time 4    Period Weeks    Status Achieved   10/31/21:  elbow ext WNL     OT SHORT TERM GOAL #  4   Title Pt will demonstrate ability to retrieve a lightweight object from overhead shelf with -10 elbow extension with LUE    Time 4    Period Weeks    Status Achieved   10/31/21:  elbow ext WNL     OT SHORT TERM GOAL #5   Title Pt will demonstrate improved fine motor coordination for ADLs as evidenced by decreasing 9 hole peg test score for RUE by 3 secs    Baseline R-33.47sec    Time 4    Period Weeks    Status Achieved   10/31/21:  29.13, (L-23.13 sec)              OT Long Term Goals - 11/09/21 0902       OT LONG TERM GOAL #1   Title Pt will verbalize understanding of ways to prevent future PD related complications and PD community resources.    Time 12    Period Weeks    Status Achieved      OT LONG TERM GOAL #2   Title Pt will demonstrate improved ease with fastening buttons as evidenced by decreasing 3 button/ unbutton time to  35 secs or less    Time 12    Period Weeks    Status Achieved   30.82     OT LONG TERM GOAL #3   Title Pt will demonstrate improved ease with feeding as evidenced by decreasing PPT#2 (self feeding) by 3 secs    Baseline 14.43    Time 12    Period Weeks    Status Achieved   11.25                  Plan - 11/09/21 1009     Clinical Impression Statement Pt has made good overall progress. She agrees with plans for d/c today and plans to return for screens in 6 mons.    OT Occupational Profile and History Problem Focused Assessment - Including review of records relating to presenting problem    Occupational performance deficits (Please refer to evaluation for details): ADL's;IADL's;Leisure    Body Structure / Function / Physical Skills ADL;UE functional use;Balance;Flexibility;FMC;Gait;ROM;GMC;Coordination;Decreased knowledge of precautions;IADL;Decreased knowledge of use of DME;Dexterity;Mobility;Tone;Strength    Rehab Potential Good    OT Frequency 2x / week    OT Duration 12 weeks    OT Treatment/Interventions Self-care/ADL training;Ultrasound;Patient/family education;DME and/or AE instruction;Aquatic Therapy;Paraffin;Passive range of motion;Balance training;Fluidtherapy;Cryotherapy;Splinting;Functional Mobility Training;Electrical Stimulation;Therapeutic exercise;Manual Therapy;Moist Heat;Therapeutic activities;Cognitive remediation/compensation;Neuromuscular education    Plan d/c OT, schedule  PD screens oin 6 mons    Consulted and Agree with Plan of Care Patient             Patient will benefit from skilled therapeutic intervention in order to improve the following deficits and impairments:   Body Structure / Function / Physical Skills: ADL, UE functional use, Balance, Flexibility, FMC, Gait, ROM, GMC, Coordination, Decreased knowledge of precautions, IADL, Decreased knowledge of use of DME, Dexterity, Mobility, Tone, Strength       Visit Diagnosis: Other lack of  coordination  Other symptoms and signs involving the nervous system  Unsteadiness on feet  Other abnormalities of gait and mobility  Other symptoms and signs involving the musculoskeletal system  Stiffness of left elbow, not elsewhere classified  Stiffness of right elbow, not elsewhere classified  Stiffness of right shoulder, not elsewhere classified  Tremor    Problem List Patient Active Problem List   Diagnosis Date Noted   Diabetes ([place]) 10/09/2021  Insomnia 10/09/2021   Parkinson's disease ([place]) 07/13/2020   Hypercholesterolemia 05/25/2016   DJD (degenerative joint disease), 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 [place], [place], [address] Phone: [phone]   Fax:  [phone]  Name: Hannah Copeland MRN: [id number] Date of Birth: 1951-03-12

## 2021-11-09 NOTE — Patient Instructions (Signed)

## 2021-11-09 NOTE — Therapy (Signed)
[facility] [address] [place], [place], [address] Phone: [phone]   Fax:  [phone]  Physical Therapy Treatment/Discharge Summary  Patient Details  Name: Hannah Copeland MRN: [id number] Date of Birth: [birth year] Referring Provider (PT): Penni Bombard, MD   Encounter Date: 11/09/2021   PT End of Session - 11/09/21 0806     Visit Number 9    Number of Visits 9    Date for PT Re-Evaluation 12/03/21    Authorization Type Medicare    PT Start Time 0805    PT Stop Time 1779    PT Time Calculation (min) 39 min    Equipment Utilized During Treatment Gait belt    Activity Tolerance Patient tolerated treatment well    Behavior During Therapy [facility] for tasks assessed/performed             Past Medical History:  Diagnosis Date   Arthritis    Hypertension    Parkinson's disease ([place]) 07/13/2020    Past Surgical History:  Procedure Laterality Date   ABDOMINAL HYSTERECTOMY      There were no vitals filed for this visit.   Subjective Assessment - 11/09/21 0806     Subjective No changes.    Pertinent History PMH: PD, HTN, osteopenia, lumbosacral DJD.    Limitations Walking    Patient Stated Goals wants to be able to manage herself, improve her quality of life    Currently in Pain? No/denies                               [facility] Adult PT Treatment/Exercise - 11/09/21 0830       Ambulation/Gait   Ambulation/Gait Yes    Ambulation/Gait Assistance 4: Min assist;4: Min guard    Ambulation/Gait Assistance Details With resisted belt, forwards gait with random perturbations with pt having to maintain balance with stepping strategy. One therapist initially providing min A> min guard and 2nd therapist providing resistance/perturbations. Performed as well with retro gait with random perturbations. Pt improved with incr reps and able to maintain her balance.    Ambulation Distance (Feet) 500  Feet   approx feet today   Assistive device None    Gait Pattern Step-through pattern;Decreased arm swing - right;Decreased trunk rotation    Ambulation Surface Outdoor;Grass      High Level Balance   High Level Balance Comments Multi-directional stepping following directions on sheet and stepping over objects for incr foot clearance; performed x3 reps. Pt needing intermittent verbal cues for posterior stepping. Pt asking to work on this at home and gave handout. Discussed making sure someone is there with pt for supervision.              Pt performs PWR! Moves in standing position (reviewed from HEP)   PWR! Up for improved posture x20 reps   PWR! Rock for improved weighshifting x10 reps each side.   PWR! Twist for improved trunk rotation x10 reps - initial cues to reset in the midline.   PWR! Step for improved step initiation x10 reps   Pt performing well with large amplitude movements      PHYSICAL THERAPY DISCHARGE SUMMARY  Visits from Start of Care: 9  Current functional level related to goals / functional outcomes: See LTGs.   Remaining deficits: Bradykinesia, impaired coordination.    Education / Equipment: HEP and optimal PD fitness program, PD community resources.    Patient agrees  to discharge. Patient goals were met. Patient is being discharged due to meeting the stated rehab goals. Pt to be scheduled for PD screen in 6 months.     PT Education - 11/09/21 0848     Education Details Optimal fitness program for D/C, screens in 6 months    Person(s) Educated Patient    Methods Explanation;Handout    Comprehension Verbalized understanding              PT Short Term Goals - 10/04/21 1205       PT SHORT TERM GOAL #1   Title ALL STGS = LTGS               PT Long Term Goals - 11/09/21 2006       PT LONG TERM GOAL #1   Title Pt will be independent with PD specific HEP in order to build upon functional gains made in therapy. ALL LTGS DUE  12/01/21    Time 8    Period Weeks    Status Achieved    Target Date 12/01/21      PT LONG TERM GOAL #2   Title Pt will verbalize understanding of local Parkinson's disease resources, including options for continued community fitness.    Baseline pt has handout, but is depending on transportation and pt will be going back to [place] soon to see family.    Time 8    Period Weeks    Status Achieved      PT LONG TERM GOAL #3   Title Pt will verbalize understanding of transition to appropriate community fitness classes upon d/c from PT to maximize gains made in therapy.    Baseline pt has handout and discussed options with patient, but is depending on transportation and pt will be going back to [place] soon to see family.    Time 8    Period Weeks    Status Deferred      PT LONG TERM GOAL #4   Title Pt will improve TUG and TUG cog score to less than or equal to 10% difference for improved dual task/decreased fall risk.    Baseline TUG 11.78, cog TUG 13.2, TUG 7.5 - cog TUG 8.87 on 10/31/21    Time 8    Period Weeks    Status Not Met      PT LONG TERM GOAL #5   Title Pt will improve miniBEST to at least a 25/28 in order to demo decr fall risk.    Baseline 22/28; 25/28 on 10/31/21    Time 8    Period Weeks    Status Achieved      PT LONG TERM GOAL #6   Title 6MWT to be assessed with LTG written.    Baseline 1433' - LTG not needed    Time 8    Period Weeks    Status Achieved                   Plan - 11/09/21 2014     Clinical Impression Statement Pt has met all LTGs. Pt is very pleased with her progress and able to verbalize/demo understanding of education for techniques to maintain large amplitude movement patterns with ADLS/functional mobility. Will D/C at this time and plan for PD screens in 6 monts.    Personal Factors and Comorbidities Comorbidity 3+    Comorbidities PD, HTN, osteopenia    Examination-Activity Limitations Stairs;Locomotion Level     Examination-Participation Restrictions Community Activity  Stability/Clinical Decision Making Stable/Uncomplicated    Rehab Potential Good    PT Frequency 2x / week    PT Duration 8 weeks    PT Treatment/Interventions ADLs/Self Care Home Management;Gait training;Stair training;Functional mobility training;Therapeutic activities;Neuromuscular re-education;Balance training;Therapeutic exercise;Patient/family education;Vestibular    PT Next Visit Plan --    PT Home Exercise Plan standing PWR moves, posterior stepping, quadruped PWR moves    Consulted and Agree with Plan of Care Patient;Family member/caregiver             Patient will benefit from skilled therapeutic intervention in order to improve the following deficits and impairments:  Abnormal gait, Decreased balance, Impaired flexibility, Decreased strength, Decreased coordination, Postural dysfunction  Visit Diagnosis: Other lack of coordination  Other symptoms and signs involving the nervous system  Unsteadiness on feet     Problem List Patient Active Problem List   Diagnosis Date Noted   Diabetes ([place]) 10/09/2021   Insomnia 10/09/2021   Parkinson's disease ([place]) 07/13/2020   Hypercholesterolemia 05/25/2016   DJD (degenerative joint disease), lumbosacral 05/23/2016   Osteopenia 05/23/2016   HTN (hypertension) 05/23/2016    Arliss Journey, PT, DPT  11/09/2021, 8:16 PM  [facility] [address] [place], [place], [address] Phone: [phone]   Fax:  [phone]  Name: Levenia Skalicky MRN: [id number] Date of Birth: [birth year]

## 2021-11-20 ENCOUNTER — Ambulatory Visit (HOSPITAL_BASED_OUTPATIENT_CLINIC_OR_DEPARTMENT_OTHER): Payer: Medicare Other | Admitting: Family Medicine

## 2022-01-01 ENCOUNTER — Other Ambulatory Visit (HOSPITAL_BASED_OUTPATIENT_CLINIC_OR_DEPARTMENT_OTHER): Payer: Self-pay | Admitting: Family Medicine

## 2022-05-29 ENCOUNTER — Ambulatory Visit: Payer: Medicare PPO | Admitting: Diagnostic Neuroimaging

## 2022-05-29 ENCOUNTER — Encounter: Payer: Self-pay | Admitting: Diagnostic Neuroimaging

## 2022-05-30 ENCOUNTER — Other Ambulatory Visit (HOSPITAL_BASED_OUTPATIENT_CLINIC_OR_DEPARTMENT_OTHER): Payer: Self-pay

## 2022-05-30 MED ORDER — BENAZEPRIL HCL 5 MG PO TABS: 5.0000 mg | ORAL_TABLET | Freq: Every day | ORAL | 0 refills | Status: AC

## 2022-06-12 ENCOUNTER — Ambulatory Visit: Payer: Medicare PPO | Attending: Occupational Therapy | Admitting: Occupational Therapy

## 2022-06-12 ENCOUNTER — Ambulatory Visit: Payer: Medicare PPO | Admitting: Physical Therapy

## 2022-06-12 ENCOUNTER — Ambulatory Visit: Payer: Medicare PPO | Admitting: Speech Pathology

## 2022-06-12 NOTE — Therapy (Deleted)
[facility] [address] [place], [place], [address] Phone: [phone]   Fax:  [phone]  Patient Details  Name: Hannah Copeland MRN: [id number] Date of Birth: Jun 26, 1951 Referring Provider:  de [place], Raymond J, MD  Encounter Date: 06/12/2022   Speech Therapy Parkinson's Disease Screen   Decibel Level today: ***dB  (WNL=70-72 dB) with sound level meter 30cm away from pt's mouth. Pt's conversational volume has *** since last treatment course  Pt {does does not:27788} report difficulty with swallowing which {does does not:27788} warrant further evaluation.  Pt {does does not:27788} report  changes in cognition which {does does not:27788} warrant further evaluation.  Pt would benefit from speech-language eval for dysarthria bedside swallow eval modified barium swallow eval - please order via EPIC or call [phone] to schedule.  Pt does does not require speech therapy services at this time. Recommend ST screen in another *** months.  Su Monks, [place] 06/12/2022, 9:00 AM  [facility] [address] [place], [place], [address] Phone: [phone]   Fax:  [phone]

## 2022-10-23 IMAGING — MG MM DIGITAL SCREENING BILAT W/ TOMO AND CAD
8 series · 8 of 24 positions shown · non-contrast
Comparison: Previous exam(s).

CLINICAL DATA: Screening.

EXAM:
DIGITAL SCREENING BILATERAL MAMMOGRAM WITH TOMOSYNTHESIS AND CAD
TECHNIQUE: Bilateral screening digital craniocaudal and mediolateral oblique
mammograms were obtained. Bilateral screening digital breast
tomosynthesis was performed. The images were evaluated with
computer-aided detection.

[R CC synth-2D]
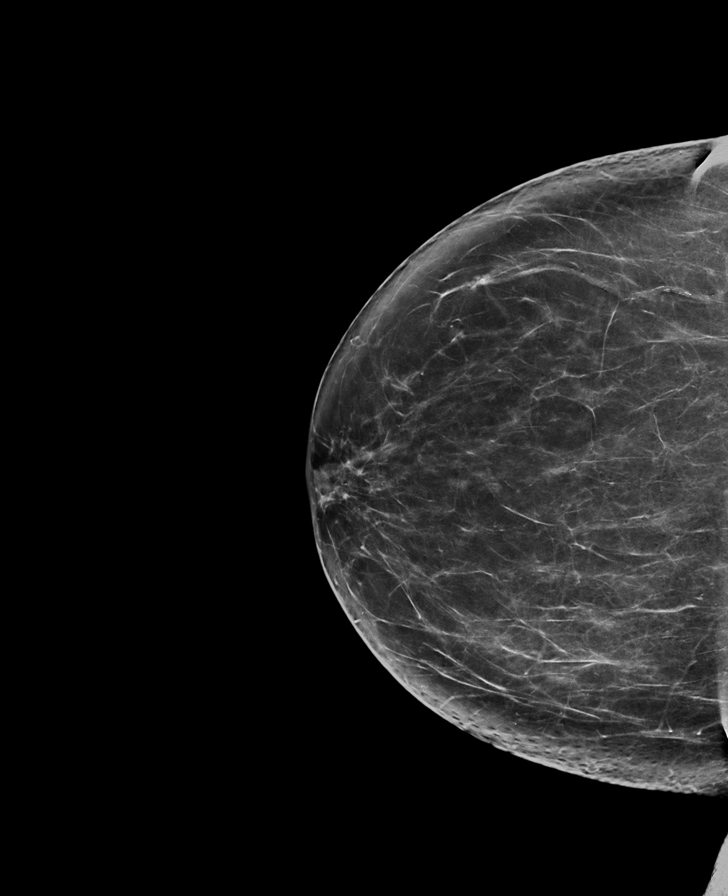

[R MLO synth-2D]
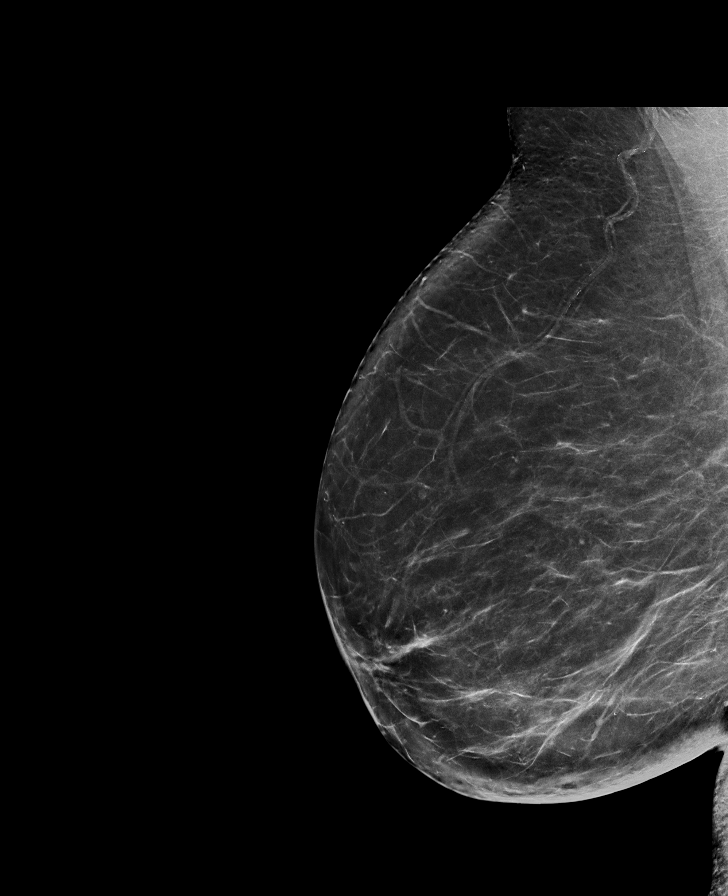

[L MLO synth-2D]
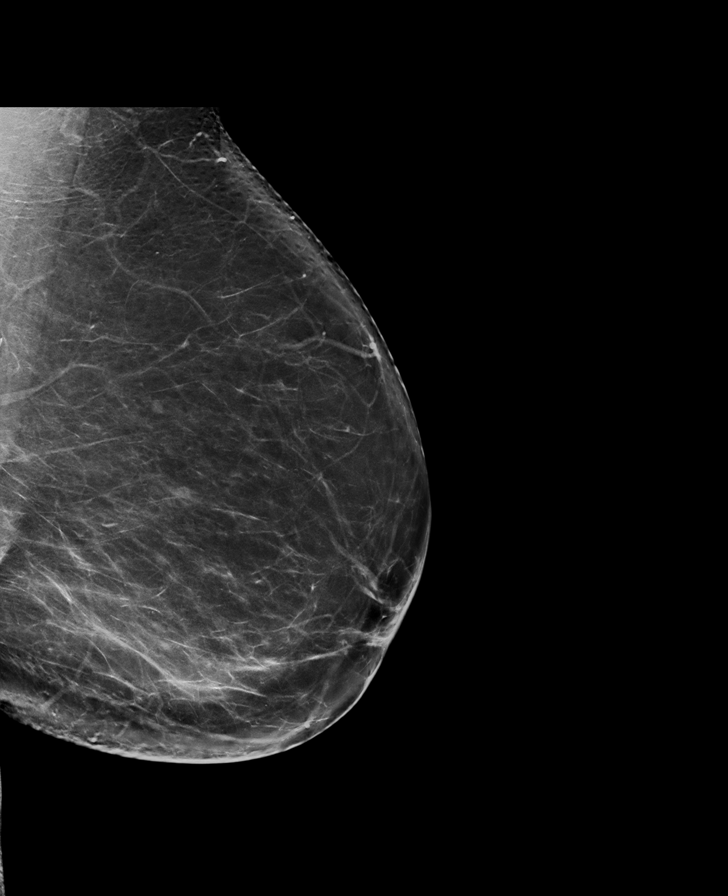

[L CC synth-2D]
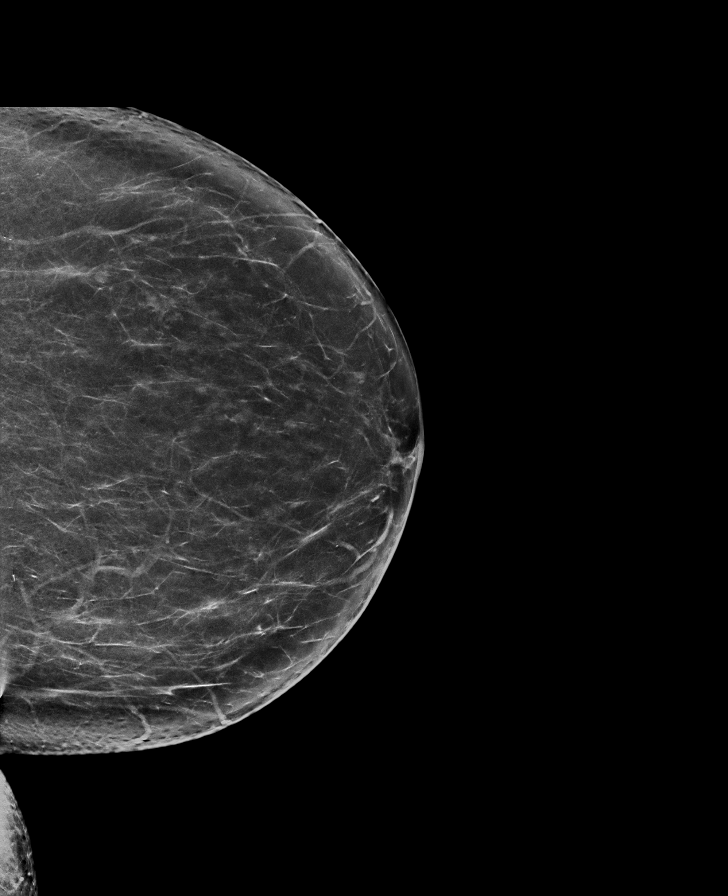

[L CC tomo · tomo slice 38/75.0]
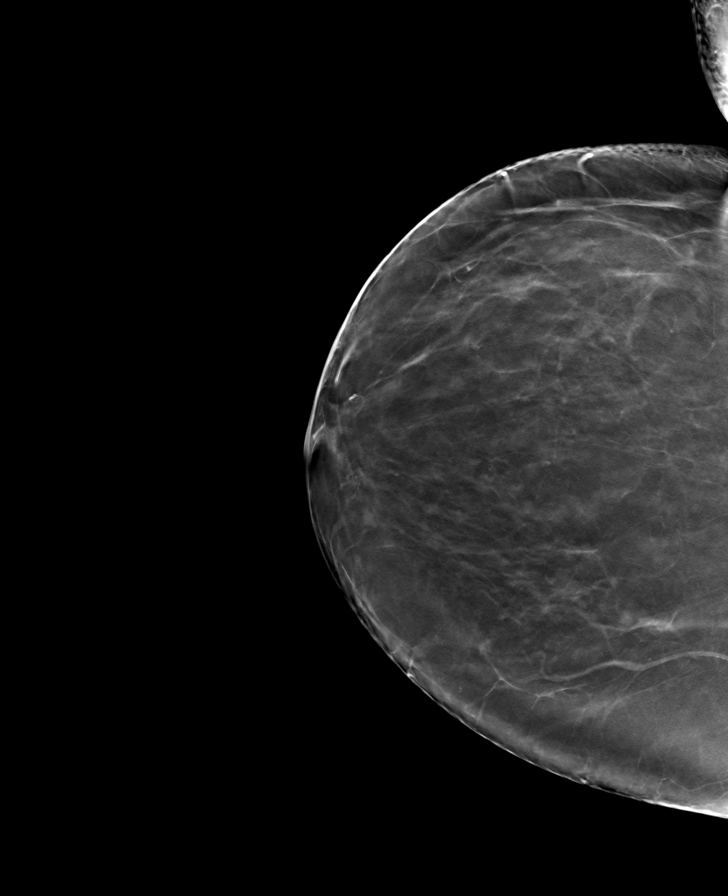

[R CC tomo · tomo slice 41/80.0]
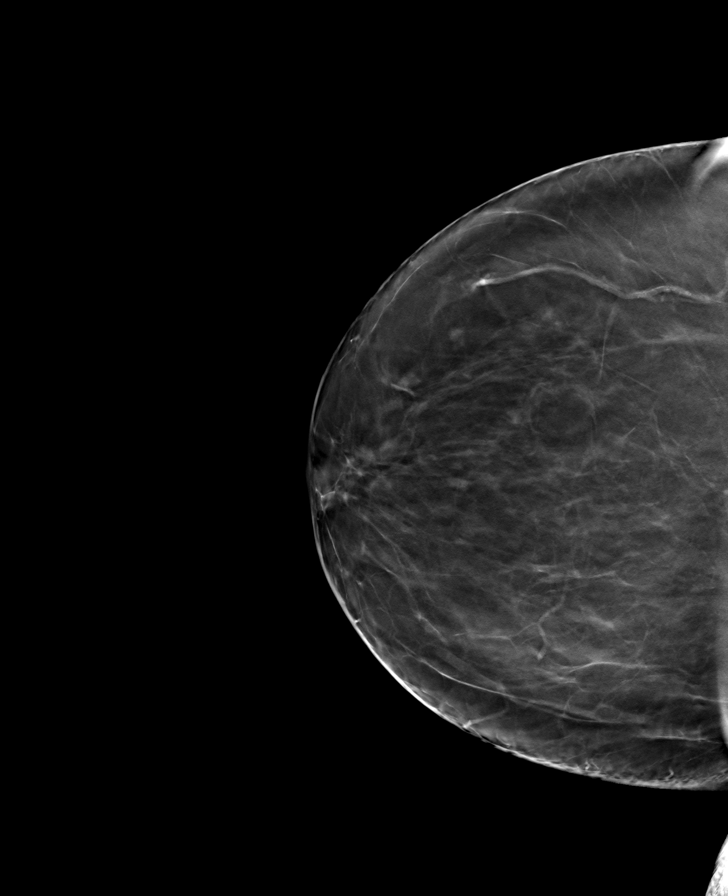

[L MLO tomo · tomo slice 42/83.0]
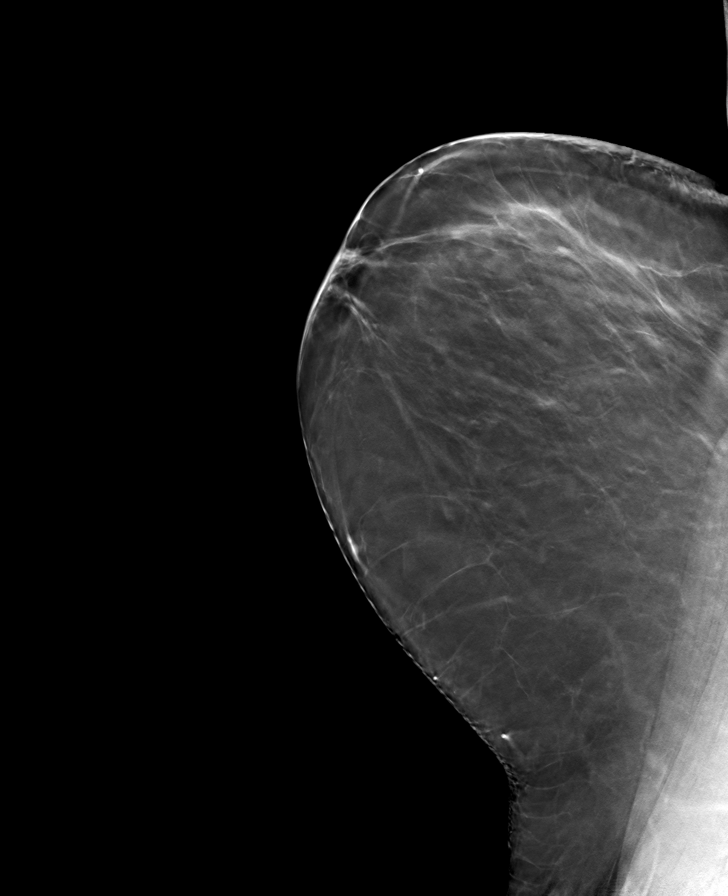

[R MLO tomo · tomo slice 43/84.0]
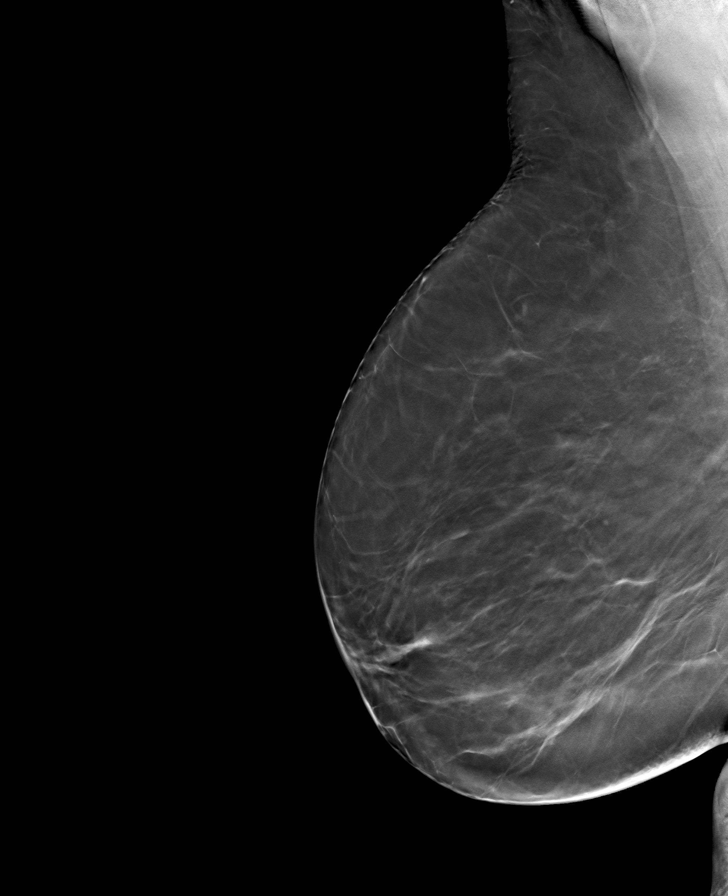

[8 of 24 positions shown; findings below may reference images not displayed]

ACR Breast Density Category b: There are scattered areas of
fibroglandular density.
FINDINGS: There are no findings suspicious for malignancy. The images were
evaluated with computer-aided detection.
IMPRESSION: No mammographic evidence of malignancy. A result letter of this
screening mammogram will be mailed directly to the patient.

RECOMMENDATION:
Screening mammogram in one year. (Code:[id number])

BI-RADS CATEGORY  1: Negative.

## 2022-11-13 ENCOUNTER — Telehealth (HOSPITAL_BASED_OUTPATIENT_CLINIC_OR_DEPARTMENT_OTHER): Payer: Self-pay | Admitting: Family Medicine

## 2022-11-13 NOTE — Telephone Encounter (Signed)
Called patient to schedule Medicare Annual Wellness Visit (AWV). Left message for patient to call back and schedule Medicare Annual Wellness Visit (AWV).  Last date of AWV: AWVI eligible as of 03/18/2019  Please schedule an appointment at any time with Lindsay, [facility].  If any questions, please contact me at [phone].    Thank you,  [place] Direct dial  [phone]

## 2023-04-22 ENCOUNTER — Telehealth (HOSPITAL_BASED_OUTPATIENT_CLINIC_OR_DEPARTMENT_OTHER): Payer: Self-pay | Admitting: Family Medicine

## 2023-04-22 NOTE — Telephone Encounter (Signed)
Lvm to schedule AWV appt. Call [phone]

## 2023-09-02 ENCOUNTER — Telehealth (HOSPITAL_BASED_OUTPATIENT_CLINIC_OR_DEPARTMENT_OTHER): Payer: Self-pay | Admitting: *Deleted

## 2023-09-02 NOTE — Telephone Encounter (Signed)
 LVM for pt to call the office to schedule medicare wellness visit

## 2023-09-03 ENCOUNTER — Encounter (HOSPITAL_BASED_OUTPATIENT_CLINIC_OR_DEPARTMENT_OTHER): Payer: Self-pay | Admitting: *Deleted
